# Patient Record
Sex: Male | Born: 1950 | Race: White | Hispanic: No | Marital: Married | Smoking: Never smoker
Health system: Southern US, Community
[De-identification: ages and names within clinical notes are randomized; demographics above are authoritative.]

## PROBLEM LIST (undated history)

## (undated) DIAGNOSIS — E7800 Pure hypercholesterolemia, unspecified: Secondary | ICD-10-CM

## (undated) DIAGNOSIS — H269 Unspecified cataract: Secondary | ICD-10-CM

## (undated) DIAGNOSIS — L723 Sebaceous cyst: Principal | ICD-10-CM

## (undated) DIAGNOSIS — T4145XA Adverse effect of unspecified anesthetic, initial encounter: Secondary | ICD-10-CM

## (undated) DIAGNOSIS — T8859XA Other complications of anesthesia, initial encounter: Secondary | ICD-10-CM

## (undated) HISTORY — PX: TONSILLECTOMY: SUR1361

## (undated) HISTORY — PX: ANTERIOR CRUCIATE LIGAMENT REPAIR: SHX115

## (undated) HISTORY — PX: COLONOSCOPY: SHX174

## (undated) HISTORY — PX: KNEE ARTHROSCOPY: SHX127

---

## 2007-02-11 ENCOUNTER — Ambulatory Visit: Payer: Self-pay | Admitting: Vascular Surgery

## 2010-02-05 ENCOUNTER — Emergency Department (HOSPITAL_COMMUNITY)
Admission: EM | Admit: 2010-02-05 | Discharge: 2010-02-05 | Payer: Self-pay | Source: Home / Self Care | Admitting: Family Medicine

## 2010-06-19 NOTE — Procedures (Signed)
DUPLEX DEEP VENOUS EXAM - LOWER EXTREMITY   INDICATION:  Rule out left leg DVT.  Patient has a painful knot in  approximately the mid calf which he has had for three days.   HISTORY:  Edema:  Yes, left calf for three days.  Trauma/Surgery:  No.  Pain:  Left calf for three days.  PE:  No.  Previous DVT:  No.  Anticoagulants:  Patient started on low dose aspirin today.  Other:   DUPLEX EXAM:                CFV   SFV   PopV  PTV    GSV                R  L  R  L  R  L  R   L  R  L  Thrombosis    0  0     0     0      0     0  Spontaneous   +  +     +     +      +     +  Phasic        +  +     +     +      +     +/0  Augmentation  +  +     +     +      +  Compressible  +  +     +     +      +     +/0  Competent     +  +     +     +      +   Legend:  + - yes  o - no  p - partial  D - decreased   IMPRESSION:  1. No evidence of deep venous thrombosis, left leg.  2. Left greater saphenous vein is thrombosed in the mid to distal left      calf.  3. Proximal left greater saphenous vein is patent.   Dr. Randel Books office was notified of these results, and a preliminary  copy was faxed to his office.    _____________________________  Larina Earthly, M.D.   DP/MEDQ  D:  02/11/2007  T:  02/11/2007  Job:  [id number]

## 2011-08-10 ENCOUNTER — Emergency Department (HOSPITAL_COMMUNITY)
Admission: EM | Admit: 2011-08-10 | Discharge: 2011-08-10 | Disposition: A | Discharge status: Home / Self Care | Payer: 59 | Attending: Emergency Medicine | Admitting: Emergency Medicine

## 2011-08-10 ENCOUNTER — Encounter (HOSPITAL_COMMUNITY): Payer: Self-pay | Admitting: Emergency Medicine

## 2011-08-10 ENCOUNTER — Emergency Department (HOSPITAL_COMMUNITY): Payer: 59

## 2011-08-10 DIAGNOSIS — Y998 Other external cause status: Secondary | ICD-10-CM | POA: Insufficient documentation

## 2011-08-10 DIAGNOSIS — S82009A Unspecified fracture of unspecified patella, initial encounter for closed fracture: Secondary | ICD-10-CM | POA: Insufficient documentation

## 2011-08-10 DIAGNOSIS — E7800 Pure hypercholesterolemia, unspecified: Secondary | ICD-10-CM | POA: Insufficient documentation

## 2011-08-10 DIAGNOSIS — Y9389 Activity, other specified: Secondary | ICD-10-CM | POA: Insufficient documentation

## 2011-08-10 HISTORY — DX: Pure hypercholesterolemia, unspecified: E78.00

## 2011-08-10 MED ORDER — OXYCODONE-ACETAMINOPHEN 7.5-325 MG PO TABS: 1.0000 | ORAL_TABLET | ORAL | Status: DC | PRN
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
## 2011-08-10 NOTE — ED Notes (Signed)
Family at bedside. 

## 2011-08-10 NOTE — Progress Notes (Signed)
Orthopedic Tech Progress Note Patient Details:  Aaron Brady [birth year] [id number]  Ortho Devices Type of Ortho Device: Crutches;Knee Immobilizer Ortho Device/Splint Location: right knee Ortho Device/Splint Interventions: Application   Aaron Brady 08/10/2011, 2:01 PM
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
## 2011-08-12 ENCOUNTER — Encounter (HOSPITAL_BASED_OUTPATIENT_CLINIC_OR_DEPARTMENT_OTHER): Payer: Self-pay | Admitting: *Deleted

## 2011-08-12 NOTE — Progress Notes (Signed)
No labs needed Has crutches and knee immobilizer on

## 2011-08-13 ENCOUNTER — Encounter (HOSPITAL_BASED_OUTPATIENT_CLINIC_OR_DEPARTMENT_OTHER): Payer: Self-pay | Admitting: Anesthesiology

## 2011-08-13 ENCOUNTER — Encounter (HOSPITAL_BASED_OUTPATIENT_CLINIC_OR_DEPARTMENT_OTHER): Payer: Self-pay | Admitting: Orthopedic Surgery

## 2011-08-13 ENCOUNTER — Ambulatory Visit (HOSPITAL_BASED_OUTPATIENT_CLINIC_OR_DEPARTMENT_OTHER): Payer: 59 | Admitting: Anesthesiology

## 2011-08-13 ENCOUNTER — Ambulatory Visit (HOSPITAL_BASED_OUTPATIENT_CLINIC_OR_DEPARTMENT_OTHER)
Admission: RE | Admit: 2011-08-13 | Discharge: 2011-08-13 | Disposition: A | Discharge status: Home / Self Care | Payer: 59 | Source: Ambulatory Visit | Attending: Orthopedic Surgery | Admitting: Orthopedic Surgery

## 2011-08-13 ENCOUNTER — Other Ambulatory Visit: Payer: Self-pay | Admitting: Orthopedic Surgery

## 2011-08-13 ENCOUNTER — Encounter (HOSPITAL_BASED_OUTPATIENT_CLINIC_OR_DEPARTMENT_OTHER): Payer: Self-pay | Admitting: *Deleted

## 2011-08-13 ENCOUNTER — Encounter (HOSPITAL_BASED_OUTPATIENT_CLINIC_OR_DEPARTMENT_OTHER)
Admission: RE | Disposition: A | Discharge status: Home / Self Care | Payer: Self-pay | Source: Ambulatory Visit | Attending: Orthopedic Surgery

## 2011-08-13 DIAGNOSIS — X58XXXA Exposure to other specified factors, initial encounter: Secondary | ICD-10-CM | POA: Insufficient documentation

## 2011-08-13 DIAGNOSIS — S82001A Unspecified fracture of right patella, initial encounter for closed fracture: Secondary | ICD-10-CM | POA: Diagnosis present

## 2011-08-13 DIAGNOSIS — S82009A Unspecified fracture of unspecified patella, initial encounter for closed fracture: Secondary | ICD-10-CM | POA: Insufficient documentation

## 2011-08-13 HISTORY — DX: Adverse effect of unspecified anesthetic, initial encounter: T41.45XA

## 2011-08-13 HISTORY — PX: ORIF PATELLA: SHX5033

## 2011-08-13 HISTORY — DX: Other complications of anesthesia, initial encounter: T88.59XA

## 2011-08-13 SURGERY — OPEN REDUCTION INTERNAL FIXATION (ORIF) PATELLA
Anesthesia: General | Site: Knee | Laterality: Right | Wound class: Clean

## 2011-08-13 MED ORDER — BUPIVACAINE-EPINEPHRINE PF 0.5-1:200000 % IJ SOLN
INTRAMUSCULAR | Status: DC | PRN
  Administered 2011-08-13: 25 mL

## 2011-08-13 MED ORDER — MIDAZOLAM HCL 2 MG/2ML IJ SOLN
0.5000 mg | INTRAMUSCULAR | Status: DC | PRN
  Administered 2011-08-13: 2 mg via INTRAVENOUS

## 2011-08-13 MED ORDER — OXYCODONE-ACETAMINOPHEN 10-325 MG PO TABS: 1.0000 | ORAL_TABLET | Freq: Four times a day (QID) | ORAL | Status: AC | PRN

## 2011-08-13 MED ORDER — PROMETHAZINE HCL 25 MG PO TABS: 25.0000 mg | ORAL_TABLET | Freq: Four times a day (QID) | ORAL | Status: DC | PRN

## 2011-08-13 MED ORDER — METHOCARBAMOL 500 MG PO TABS: 500.0000 mg | ORAL_TABLET | Freq: Four times a day (QID) | ORAL | Status: AC

## 2011-08-13 MED ORDER — HYDROMORPHONE HCL PF 1 MG/ML IJ SOLN: 0.2500 mg | INTRAMUSCULAR | Status: DC | PRN

## 2011-08-13 MED ORDER — FENTANYL CITRATE 0.05 MG/ML IJ SOLN
50.0000 ug | INTRAMUSCULAR | Status: DC | PRN
  Administered 2011-08-13: 100 ug via INTRAVENOUS

## 2011-08-13 MED ORDER — ONDANSETRON HCL 4 MG/2ML IJ SOLN
INTRAMUSCULAR | Status: DC | PRN
  Administered 2011-08-13: 4 mg via INTRAVENOUS

## 2011-08-13 MED ORDER — DEXAMETHASONE SODIUM PHOSPHATE 4 MG/ML IJ SOLN
INTRAMUSCULAR | Status: DC | PRN
  Administered 2011-08-13: 10 mg via INTRAVENOUS

## 2011-08-13 MED ORDER — LIDOCAINE HCL (CARDIAC) 20 MG/ML IV SOLN
INTRAVENOUS | Status: DC | PRN
  Administered 2011-08-13: 100 mg via INTRAVENOUS

## 2011-08-13 MED ORDER — LACTATED RINGERS IV SOLN
INTRAVENOUS | Status: DC
  Administered 2011-08-13 (×2): via INTRAVENOUS

## 2011-08-13 MED ORDER — CEFAZOLIN SODIUM-DEXTROSE 2-3 GM-% IV SOLR: 2.0000 g | INTRAVENOUS | Status: DC

## 2011-08-13 MED ORDER — OXYCODONE HCL 5 MG PO TABS
5.0000 mg | ORAL_TABLET | Freq: Once | ORAL | Status: AC | PRN
  Administered 2011-08-13: 5 mg via ORAL

## 2011-08-13 MED ORDER — METOCLOPRAMIDE HCL 5 MG/ML IJ SOLN: 10.0000 mg | Freq: Once | INTRAMUSCULAR | Status: DC | PRN

## 2011-08-13 MED ORDER — ACETAMINOPHEN 10 MG/ML IV SOLN
1000.0000 mg | Freq: Once | INTRAVENOUS | Status: AC
  Administered 2011-08-13: 1000 mg via INTRAVENOUS

## 2011-08-13 MED ORDER — PROPOFOL 10 MG/ML IV EMUL
INTRAVENOUS | Status: DC | PRN
  Administered 2011-08-13: 300 mg via INTRAVENOUS

## 2011-08-13 SURGICAL SUPPLY — 73 items
BANDAGE ELASTIC 4 VELCRO ST LF (GAUZE/BANDAGES/DRESSINGS) ×2 IMPLANT
BANDAGE ELASTIC 6 VELCRO ST LF (GAUZE/BANDAGES/DRESSINGS) ×2 IMPLANT
BANDAGE ESMARK 6X9 LF (GAUZE/BANDAGES/DRESSINGS) ×1 IMPLANT
BENZOIN TINCTURE PRP APPL 2/3 (GAUZE/BANDAGES/DRESSINGS) IMPLANT
BIT DRILL CANN 2.7X625 NONSTRL (BIT) ×2 IMPLANT
BLADE SURG 15 STRL LF DISP TIS (BLADE) ×3 IMPLANT
BLADE SURG 15 STRL SS (BLADE) ×3
BNDG ESMARK 6X9 LF (GAUZE/BANDAGES/DRESSINGS) ×2
CANISTER SUCTION 1200CC (MISCELLANEOUS) ×2 IMPLANT
CLOTH BEACON ORANGE TIMEOUT ST (SAFETY) ×2 IMPLANT
CUFF TOURNIQUET SINGLE 34IN LL (TOURNIQUET CUFF) IMPLANT
DECANTER SPIKE VIAL GLASS SM (MISCELLANEOUS) IMPLANT
DRAPE EXTREMITY T 121X128X90 (DRAPE) ×2 IMPLANT
DRAPE OEC MINIVIEW 54X84 (DRAPES) ×2 IMPLANT
DRAPE U-SHAPE 47X51 STRL (DRAPES) ×2 IMPLANT
DURAPREP 26ML APPLICATOR (WOUND CARE) ×2 IMPLANT
ELECT REM PT RETURN 9FT ADLT (ELECTROSURGICAL) ×2
ELECTRODE REM PT RTRN 9FT ADLT (ELECTROSURGICAL) ×1 IMPLANT
GLOVE BIOGEL PI IND STRL 7.5 (GLOVE) ×1 IMPLANT
GLOVE BIOGEL PI IND STRL 8 (GLOVE) ×2 IMPLANT
GLOVE BIOGEL PI INDICATOR 7.5 (GLOVE) ×1
GLOVE BIOGEL PI INDICATOR 8 (GLOVE) ×2
GLOVE ECLIPSE 6.5 STRL STRAW (GLOVE) ×4 IMPLANT
GLOVE INDICATOR 7.0 STRL GRN (GLOVE) ×4 IMPLANT
GLOVE ORTHO TXT STRL SZ7.5 (GLOVE) ×2 IMPLANT
GLOVE SKINSENSE NS SZ7.5 (GLOVE) ×1
GLOVE SKINSENSE STRL SZ7.5 (GLOVE) ×1 IMPLANT
GLOVE SURG ORTHO 8.0 STRL STRW (GLOVE) ×2 IMPLANT
GOWN PREVENTION PLUS XLARGE (GOWN DISPOSABLE) ×4 IMPLANT
GOWN PREVENTION PLUS XXLARGE (GOWN DISPOSABLE) ×2 IMPLANT
GOWN STRL REIN 2XL XLG LVL4 (GOWN DISPOSABLE) ×2 IMPLANT
GUIDEWARE NON THREAD 1.25X150 (WIRE) ×4
GUIDEWIRE NON THREAD 1.25X150 (WIRE) ×2 IMPLANT
NEEDLE KEITH (NEEDLE) IMPLANT
NS IRRIG 1000ML POUR BTL (IV SOLUTION) ×2 IMPLANT
PACK ARTHROSCOPY DSU (CUSTOM PROCEDURE TRAY) ×2 IMPLANT
PACK BASIN DAY SURGERY FS (CUSTOM PROCEDURE TRAY) ×2 IMPLANT
PAD CAST 4YDX4 CTTN HI CHSV (CAST SUPPLIES) ×1 IMPLANT
PADDING CAST ABS 4INX4YD NS (CAST SUPPLIES) ×1
PADDING CAST ABS COTTON 4X4 ST (CAST SUPPLIES) ×1 IMPLANT
PADDING CAST COTTON 4X4 STRL (CAST SUPPLIES) ×1
PENCIL BUTTON HOLSTER BLD 10FT (ELECTRODE) ×2 IMPLANT
SCREW CANN S THRD/36 4.0 (Screw) ×2 IMPLANT
SCREW SHORT THREAD 4.0X32 (Screw) ×4 IMPLANT
SLEEVE SCD COMPRESS KNEE MED (MISCELLANEOUS) ×2 IMPLANT
SPONGE GAUZE 4X4 12PLY (GAUZE/BANDAGES/DRESSINGS) ×2 IMPLANT
SPONGE LAP 18X18 X RAY DECT (DISPOSABLE) ×2 IMPLANT
SPONGE LAP 4X18 X RAY DECT (DISPOSABLE) ×2 IMPLANT
STAPLER VISISTAT 35W (STAPLE) IMPLANT
STRIP CLOSURE SKIN 1/2X4 (GAUZE/BANDAGES/DRESSINGS) IMPLANT
SUCTION FRAZIER TIP 10 FR DISP (SUCTIONS) ×2 IMPLANT
SUT ETHILON 3 0 PS 1 (SUTURE) IMPLANT
SUT ETHILON 4 0 PS 2 18 (SUTURE) IMPLANT
SUT FIBERWIRE #2 38 T-5 BLUE (SUTURE) ×4
SUT FIBERWIRE #5 38 CONV NDL (SUTURE)
SUT MNCRL AB 4-0 PS2 18 (SUTURE) IMPLANT
SUT VIC AB 0 CT1 27 (SUTURE) ×1
SUT VIC AB 0 CT1 27XBRD ANBCTR (SUTURE) ×1 IMPLANT
SUT VIC AB 1 CT1 27 (SUTURE) ×1
SUT VIC AB 1 CT1 27XBRD ANBCTR (SUTURE) ×1 IMPLANT
SUT VIC AB 2-0 SH 18 (SUTURE) IMPLANT
SUT VIC AB 2-0 SH 27 (SUTURE)
SUT VIC AB 2-0 SH 27XBRD (SUTURE) IMPLANT
SUT VIC AB 3-0 SH 27 (SUTURE) ×2
SUT VIC AB 3-0 SH 27X BRD (SUTURE) ×2 IMPLANT
SUT VICRYL 3-0 CR8 SH (SUTURE) IMPLANT
SUT VICRYL 4-0 PS2 18IN ABS (SUTURE) IMPLANT
SUTURE FIBERWR #2 38 T-5 BLUE (SUTURE) ×2 IMPLANT
SUTURE FIBERWR #5 38 CONV NDL (SUTURE) IMPLANT
SYR BULB 3OZ (MISCELLANEOUS) ×2 IMPLANT
UNDERPAD 30X30 INCONTINENT (UNDERPADS AND DIAPERS) ×2 IMPLANT
WATER STERILE IRR 1000ML POUR (IV SOLUTION) IMPLANT
YANKAUER SUCT BULB TIP NO VENT (SUCTIONS) IMPLANT

## 2011-08-13 NOTE — Anesthesia Preprocedure Evaluation (Signed)
Anesthesia Evaluation  Patient identified by MRN, date of birth, ID band Patient awake    Reviewed: Allergy & Precautions, H&P , NPO status , Patient's Chart, lab work & pertinent test results, reviewed documented beta blocker date and time   Airway Mallampati: II TM Distance: >3 FB Neck ROM: full    Dental   Pulmonary neg pulmonary ROS,          Cardiovascular negative cardio ROS      Neuro/Psych negative neurological ROS  negative psych ROS   GI/Hepatic negative GI ROS, Neg liver ROS,   Endo/Other  negative endocrine ROS  Renal/GU negative Renal ROS  negative genitourinary   Musculoskeletal   Abdominal   Peds  Hematology negative hematology ROS (+)   Anesthesia Other Findings See surgeon's H&P   Reproductive/Obstetrics negative OB ROS                           Anesthesia Physical Anesthesia Plan  ASA: I  Anesthesia Plan: General   Post-op Pain Management:    Induction: Intravenous  Airway Management Planned: LMA  Additional Equipment:   Intra-op Plan:   Post-operative Plan: Extubation in OR  Informed Consent: I have reviewed the patients History and Physical, chart, labs and discussed the procedure including the risks, benefits and alternatives for the proposed anesthesia with the patient or authorized representative who has indicated his/her understanding and acceptance.   Dental Advisory Given  Plan Discussed with: CRNA and Surgeon  Anesthesia Plan Comments:         Anesthesia Quick Evaluation

## 2011-08-13 NOTE — Anesthesia Procedure Notes (Addendum)
Anesthesia Regional Block:  Femoral nerve block  Pre-Anesthetic Checklist: ,, timeout performed, Correct Patient, Correct Site, Correct Laterality, Correct Procedure, Correct Position, site marked, Risks and benefits discussed,  Surgical consent,  Pre-op evaluation,  At surgeon's request and post-op pain management  Laterality: Right  Prep: chloraprep       Needles:   Needle Type: Other   (Arrow Echogenic)   Needle Length: 9cm  Needle Gauge: 21    Additional Needles:  Procedures: ultrasound guided Femoral nerve block Narrative:  Start time: 08/13/2011 9:18 AM End time: 08/13/2011 9:25 AM Injection made incrementally with aspirations every 5 mL.  Performed by: Personally  Anesthesiologist: C. Frederick MD  Additional Notes: Ultrasound guidance used to: id relevant anatomy, confirm needle position, local anesthetic spread, avoidance of vascular puncture. Picture saved. No complications. Block performed personally by Janetta Hora. Gelene Mink, MD    Femoral nerve block Procedure Name: LMA Insertion Date/Time: 08/13/2011 10:44 AM Performed by: Zenia Resides D Pre-anesthesia Checklist: Patient identified, Emergency Drugs available, Suction available, Patient being monitored and Timeout performed Patient Re-evaluated:Patient Re-evaluated prior to inductionOxygen Delivery Method: Circle System Utilized Preoxygenation: Pre-oxygenation with 100% oxygen Intubation Type: IV induction Ventilation: Mask ventilation without difficulty LMA: LMA inserted LMA Size: 4.0 Number of attempts: 1 Airway Equipment and Method: bite block Placement Confirmation: positive ETCO2 and breath sounds checked- equal and bilateral Tube secured with: Tape Dental Injury: Teeth and Oropharynx as per pre-operative assessment

## 2011-08-13 NOTE — Op Note (Signed)
08/13/2011  12:20 PM  PATIENT:  Aaron Brady    PRE-OPERATIVE DIAGNOSIS:  patella fracture on right  POST-OPERATIVE DIAGNOSIS:  Same  PROCEDURE:  OPEN REDUCTION INTERNAL (ORIF) FIXATION PATELLA  SURGEON:  Eulas Post, MD  PHYSICIAN ASSISTANT: Janace Litten, OPA-C, present and scrubbed throughout the case, critical for completion in a timely fashion, and for retraction, instrumentation, and closure.  ANESTHESIA:   General  PREOPERATIVE INDICATIONS:  Aaron Brady is a  61 y.o. male with a diagnosis of patella fracture on right who failed conservative measures and elected for surgical management.  He had a stellate complex four-part patella fracture.  The risks benefits and alternatives were discussed with the patient preoperatively including but not limited to the risks of infection, bleeding, nerve injury, cardiopulmonary complications, the need for revision surgery, among others, and the patient was willing to proceed. We also discussed the risks of hardware prominence, hardware failure, need for hardware removal, among others.  OPERATIVE IMPLANTS: 4.0 mm cannulated screws x2 with a total of 2 #2 FiberWire in a figure-of-eight fashion through the screws and tied over the top.  OPERATIVE FINDINGS: Stellate comminuted patella fracture  OPERATIVE PROCEDURE: The patient was brought to the operating room and placed in the supine position. General anesthesia was administered. IV antibiotics were given. The right lower extremity was prepped and draped in usual sterile fashion. Time out was performed. The leg was elevated and exsanguinated and tourniquet was inflated. Anterior incision was made over the fracture site. Fracture pieces were identified and cleaned and all soft tissue that was interposed was removed. There were 2 very small pieces, one on the medial side and one on the lateral side, that were excised. These were too small for fixation. The main segment was then a superior and inferior  segment, and these were held together with a clamp, and then K wires were placed across the fracture, and the length of the K wires measured. I intentionally left the screws slightly shy of the far cortex, with the intention of tying FiberWire over the screw tips. This would leave a bone bridge to prevent FiberWire breakage.  I confirmed with C-arm appropriate reduction and anatomic position of the articular surface. I then drilled partially threaded 40 cannulated screws across both sides of the fracture. Initially, I placed the FiberWire, but then I was unsatisfied comments are removed the FiberWire and placed the screw that was through the distal pole, and turned it a couple more turns to make sure the head was completely flush with the distal pole. Excellent compression and fixation was achieved. I then used a suture passer to pass 2 #2 FiberWire is through each of the cannulated screws and then tied them, one superiorly and one inferiorly. Excellent fixation was achieved.  I irrigated the wounds copiously, and repaired the retinaculum with #1 Vicryl on either side, and then repaired the subcutaneous tissue with 3-0 Vicryl followed by Steri-Strips and sterile gauze for the skin. He alert he received a femoral nerve block. The tourniquet was released, total tourniquet time approximately one hour and 20 minutes. He tolerated the procedure well and there no complications. Sterile gauze as well as a knee immobilizer was applied.

## 2011-08-13 NOTE — Transfer of Care (Signed)
Immediate Anesthesia Transfer of Care Note  Patient: Aaron Brady  Procedure(s) Performed: Procedure(s) (LRB): OPEN REDUCTION INTERNAL (ORIF) FIXATION PATELLA (Right)  Patient Location: PACU  Anesthesia Type: General and Regional  Level of Consciousness: awake and oriented  Airway & Oxygen Therapy: Patient Spontanous Breathing and Patient connected to face mask oxygen  Post-op Assessment: Report given to PACU RN and Post -op Vital signs reviewed and stable  Post vital signs: Reviewed and stable  Complications: No apparent anesthesia complications

## 2011-08-13 NOTE — Progress Notes (Signed)
Assisted Dr. Frederick with right, ultrasound guided, femoral block. Side rails up, monitors on throughout procedure. See vital signs in flow sheet. Tolerated Procedure well. 

## 2011-08-13 NOTE — Anesthesia Postprocedure Evaluation (Signed)
Anesthesia Post Note  Patient: Aaron Brady  Procedure(s) Performed: Procedure(s) (LRB): OPEN REDUCTION INTERNAL (ORIF) FIXATION PATELLA (Right)  Anesthesia type: General  Patient location: PACU  Post pain: Pain level controlled  Post assessment: Patient's Cardiovascular Status Stable  Last Vitals:  Filed Vitals:   08/13/11 1328  BP: 155/85  Pulse: 79  Temp: 36.4 C  Resp: 16    Post vital signs: Reviewed and stable  Level of consciousness: alert  Complications: No apparent anesthesia complications

## 2011-08-13 NOTE — H&P (Signed)
  PREOPERATIVE H&P  Chief Complaint: patella fracture on right  HPI: Aaron Brady is a 61 y.o. male who presents for preoperative history and physical with a diagnosis of patella fracture on right. Symptoms are rated as moderate to severe, and have been worsening.  This is significantly impairing activities of daily living.  He has elected for surgical management.   Past Medical History  Diagnosis Date  . Hypercholesterolemia   . No pertinent past medical history   . Complication of anesthesia     gets cold and shaky post op   Past Surgical History  Procedure Date  . Anterior cruciate ligament repair left knee  . Fractured left leg   . Tonsillectomy   . Colonoscopy   . Knee arthroscopy     right   History   Social History  . Marital Status: Married    Spouse Name: N/A    Number of Children: N/A  . Years of Education: N/A   Social History Main Topics  . Smoking status: Never Smoker   . Smokeless tobacco: None  . Alcohol Use: No  . Drug Use: No  . Sexually Active:    Other Topics Concern  . None   Social History Narrative  . None   History reviewed. No pertinent family history. Allergies  Allergen Reactions  . Penicillins Hives   Prior to Admission medications   Medication Sig Start Date End Date Taking? Authorizing Provider  ibuprofen (ADVIL,MOTRIN) 200 MG tablet Take 400 mg by mouth every 6 (six) hours as needed. For pain   Yes Historical Provider, MD  oxyCODONE-acetaminophen (PERCOCET) 7.5-325 MG per tablet Take 1 tablet by mouth every 4 (four) hours as needed for pain. 08/10/11 08/20/11 Yes Nelia Shi, MD  pravastatin (PRAVACHOL) 40 MG tablet Take 40 mg by mouth daily.   Yes Historical Provider, MD     Positive ROS: All other systems have been reviewed and were otherwise negative with the exception of those mentioned in the HPI and as above.  Physical Exam: General: Alert, no acute distress Cardiovascular: No pedal edema Respiratory: No cyanosis, no use  of accessory musculature GI: No organomegaly, abdomen is soft and non-tender Skin: No lesions in the area of chief complaint Neurologic: Sensation intact distally Psychiatric: Patient is competent for consent with normal mood and affect Lymphatic: No axillary or cervical lymphadenopathy  MUSCULOSKELETAL: right knee has incompetent extensor mechanism, sensation intact.   Assessment: patella fracture on right  Plan: Plan for Procedure(s): OPEN REDUCTION INTERNAL (ORIF) FIXATION PATELLA  The risks benefits and alternatives were discussed with the patient including but not limited to the risks of nonoperative treatment, versus surgical intervention including infection, bleeding, nerve injury,  blood clots, cardiopulmonary complications, morbidity, mortality, among others, and they were willing to proceed.   Aaron Brady P, MD Cell [phone] Pager [phone]  08/13/2011 10:24 AM

## 2011-08-15 ENCOUNTER — Encounter (HOSPITAL_BASED_OUTPATIENT_CLINIC_OR_DEPARTMENT_OTHER): Payer: Self-pay | Admitting: Orthopedic Surgery

## 2012-01-05 DIAGNOSIS — H269 Unspecified cataract: Secondary | ICD-10-CM

## 2012-01-05 DIAGNOSIS — L723 Sebaceous cyst: Secondary | ICD-10-CM

## 2012-01-05 HISTORY — DX: Sebaceous cyst: L72.3

## 2012-01-05 HISTORY — DX: Unspecified cataract: H26.9

## 2012-01-08 ENCOUNTER — Encounter (INDEPENDENT_AMBULATORY_CARE_PROVIDER_SITE_OTHER): Payer: Self-pay | Admitting: Surgery

## 2012-01-08 ENCOUNTER — Ambulatory Visit (INDEPENDENT_AMBULATORY_CARE_PROVIDER_SITE_OTHER): Payer: Commercial Managed Care - PPO | Admitting: Surgery

## 2012-01-08 VITALS — BP 118/86 | HR 68 | Temp 97.3°F | Resp 12 | Ht 69.0 in | Wt 184.2 lb

## 2012-01-08 DIAGNOSIS — L723 Sebaceous cyst: Secondary | ICD-10-CM

## 2012-01-08 NOTE — Patient Instructions (Signed)

## 2012-01-08 NOTE — Progress Notes (Signed)
General Surgery [facility]  Chief Complaint  Patient presents with  . New Evaluation    eval of seb cyst - mid back - referral from Dr. Blair Heys    HISTORY: The patient is a 61 year old white male referred by his primary care physician with an enlarging sebaceous cyst in the right mid back. Patient states that this is been present for some time and gradually increasing in size. It causes minor discomfort. He has had no drainage. He has had no history of infection. He has never required incision and drainage. He has had no other such lesions removed in the past.  Past Medical History  Diagnosis Date  . Hypercholesterolemia   . No pertinent past medical history   . Complication of anesthesia     gets cold and shaky post op  . Right patella fracture 08/13/2011     Current Outpatient Prescriptions  Medication Sig Dispense Refill  . pravastatin (PRAVACHOL) 40 MG tablet Take 40 mg by mouth daily.      . promethazine (PHENERGAN) 25 MG tablet Take 1 tablet (25 mg total) by mouth every 6 (six) hours as needed for nausea.  30 tablet  0     Allergies  Allergen Reactions  . Penicillins Hives     Family History  Problem Relation Age of Onset  . Heart disease Father   . Cancer Sister     melanoma  . Cancer Paternal Grandfather     stomach     History   Social History  . Marital Status: Married    Spouse Name: N/A    Number of Children: N/A  . Years of Education: N/A   Social History Main Topics  . Smoking status: Never Smoker   . Smokeless tobacco: Never Used  . Alcohol Use: No  . Drug Use: No  . Sexually Active: None   Other Topics Concern  . None   Social History Narrative  . None     REVIEW OF SYSTEMS - PERTINENT POSITIVES ONLY: Denies drainage. Denies infection. Minor pain. Progressive slow enlargement.  EXAM: Filed Vitals:   01/08/12 0846  BP: 118/86  Pulse: 68  Temp: 97.3 F (36.3 C)  Resp: 12    HEENT: normocephalic;  pupils equal and reactive; sclerae clear; dentition good; mucous membranes moist NECK:  symmetric on extension; no palpable anterior or posterior cervical lymphadenopathy; no supraclavicular masses; no tenderness CHEST: clear to auscultation bilaterally without rales, rhonchi, or wheezes CARDIAC: regular rate and rhythm without significant murmur; peripheral pulses are full BACK:  In the mid back just to the right of midline is a 3 cm epidermal inclusion cyst with slight overlying erythema, no fluctuance, minimal tenderness EXT:  non-tender without edema; no deformity NEURO: no gross focal deficits; no sign of tremor   LABORATORY RESULTS: See [facility] ([facility]) for most recent results   RADIOLOGY RESULTS: See [facility] ([facility]pic) for most recent results   IMPRESSION: Epidermal inclusion cyst, 3 cm, right mid back  PLAN: I discussed with the patient the indications for excision. We discussed the procedure and the postoperative course to be anticipated. We discussed potential complications. Patient understands and wishes to proceed in the near future. We will make arrangements for outpatient surgery.  The risks and benefits of the procedure have been discussed at length with the patient.  The patient understands the proposed procedure, potential alternative treatments, and the course of recovery to be expected.  All of the patient's questions have  been answered at this time.  The patient wishes to proceed with surgery.  Velora Heckler, MD, FACS General & Endocrine Surgery [facility]   Visit Diagnoses: 1. Sebaceous cyst, right mid back     Primary Care Physician: Thora Lance, MD

## 2012-01-10 ENCOUNTER — Encounter (HOSPITAL_BASED_OUTPATIENT_CLINIC_OR_DEPARTMENT_OTHER): Payer: Self-pay | Admitting: *Deleted

## 2012-01-13 ENCOUNTER — Encounter (HOSPITAL_BASED_OUTPATIENT_CLINIC_OR_DEPARTMENT_OTHER): Payer: Self-pay | Admitting: *Deleted

## 2012-01-13 ENCOUNTER — Telehealth (INDEPENDENT_AMBULATORY_CARE_PROVIDER_SITE_OTHER): Payer: Self-pay

## 2012-01-13 NOTE — Telephone Encounter (Signed)
Lmom with date of appt 02-11-12 arrive at 9:45.

## 2012-01-14 ENCOUNTER — Ambulatory Visit (HOSPITAL_BASED_OUTPATIENT_CLINIC_OR_DEPARTMENT_OTHER): Payer: 59 | Admitting: Anesthesiology

## 2012-01-14 ENCOUNTER — Encounter (HOSPITAL_BASED_OUTPATIENT_CLINIC_OR_DEPARTMENT_OTHER): Payer: Self-pay | Admitting: Surgery

## 2012-01-14 ENCOUNTER — Encounter (HOSPITAL_BASED_OUTPATIENT_CLINIC_OR_DEPARTMENT_OTHER)
Admission: RE | Disposition: A | Discharge status: Home / Self Care | Payer: Self-pay | Source: Ambulatory Visit | Attending: Surgery

## 2012-01-14 ENCOUNTER — Encounter (HOSPITAL_BASED_OUTPATIENT_CLINIC_OR_DEPARTMENT_OTHER): Payer: Self-pay | Admitting: Anesthesiology

## 2012-01-14 ENCOUNTER — Encounter (HOSPITAL_BASED_OUTPATIENT_CLINIC_OR_DEPARTMENT_OTHER): Payer: Self-pay | Admitting: *Deleted

## 2012-01-14 ENCOUNTER — Ambulatory Visit (HOSPITAL_BASED_OUTPATIENT_CLINIC_OR_DEPARTMENT_OTHER)
Admission: RE | Admit: 2012-01-14 | Discharge: 2012-01-14 | Disposition: A | Discharge status: Home / Self Care | Payer: 59 | Source: Ambulatory Visit | Attending: Surgery | Admitting: Surgery

## 2012-01-14 DIAGNOSIS — L723 Sebaceous cyst: Secondary | ICD-10-CM | POA: Insufficient documentation

## 2012-01-14 DIAGNOSIS — E7800 Pure hypercholesterolemia, unspecified: Secondary | ICD-10-CM | POA: Insufficient documentation

## 2012-01-14 DIAGNOSIS — Z880 Allergy status to penicillin: Secondary | ICD-10-CM | POA: Insufficient documentation

## 2012-01-14 HISTORY — PX: EAR CYST EXCISION: SHX22

## 2012-01-14 HISTORY — DX: Sebaceous cyst: L72.3

## 2012-01-14 HISTORY — DX: Unspecified cataract: H26.9

## 2012-01-14 SURGERY — CYST REMOVAL
Anesthesia: Monitor Anesthesia Care | Site: Back | Wound class: Clean

## 2012-01-14 MED ORDER — LIDOCAINE HCL (CARDIAC) 20 MG/ML IV SOLN
INTRAVENOUS | Status: DC | PRN
  Administered 2012-01-14: 50 mg via INTRAVENOUS

## 2012-01-14 MED ORDER — FENTANYL CITRATE 0.05 MG/ML IJ SOLN: 25.0000 ug | INTRAMUSCULAR | Status: DC | PRN

## 2012-01-14 MED ORDER — PROPOFOL 10 MG/ML IV EMUL
INTRAVENOUS | Status: DC | PRN
  Administered 2012-01-14: 80 ug/kg/min via INTRAVENOUS

## 2012-01-14 MED ORDER — CEFAZOLIN SODIUM-DEXTROSE 2-3 GM-% IV SOLR
2.0000 g | INTRAVENOUS | Status: AC
  Administered 2012-01-14: 2 g via INTRAVENOUS

## 2012-01-14 MED ORDER — ONDANSETRON HCL 4 MG/2ML IJ SOLN: 4.0000 mg | Freq: Four times a day (QID) | INTRAMUSCULAR | Status: DC | PRN

## 2012-01-14 MED ORDER — LACTATED RINGERS IV SOLN
INTRAVENOUS | Status: DC
  Administered 2012-01-14: 13:00:00 via INTRAVENOUS

## 2012-01-14 MED ORDER — OXYCODONE HCL 5 MG PO TABS: 5.0000 mg | ORAL_TABLET | Freq: Once | ORAL | Status: DC | PRN

## 2012-01-14 MED ORDER — HYDROCODONE-ACETAMINOPHEN 5-325 MG PO TABS
1.0000 | ORAL_TABLET | ORAL | Status: AC | PRN
Start: 2012-01-14 — End: ?

## 2012-01-14 MED ORDER — BUPIVACAINE HCL (PF) 0.5 % IJ SOLN
INTRAMUSCULAR | Status: DC | PRN
  Administered 2012-01-14: 15 mL

## 2012-01-14 MED ORDER — ONDANSETRON HCL 4 MG/2ML IJ SOLN
INTRAMUSCULAR | Status: DC | PRN
  Administered 2012-01-14: 4 mg via INTRAVENOUS

## 2012-01-14 MED ORDER — OXYCODONE HCL 5 MG/5ML PO SOLN: 5.0000 mg | Freq: Once | ORAL | Status: DC | PRN

## 2012-01-14 MED ORDER — MIDAZOLAM HCL 5 MG/5ML IJ SOLN
INTRAMUSCULAR | Status: DC | PRN
  Administered 2012-01-14: 2 mg via INTRAVENOUS

## 2012-01-14 MED ORDER — FENTANYL CITRATE 0.05 MG/ML IJ SOLN
INTRAMUSCULAR | Status: DC | PRN
  Administered 2012-01-14: 100 ug via INTRAVENOUS

## 2012-01-14 SURGICAL SUPPLY — 40 items
BANDAGE GAUZE ELAST BULKY 4 IN (GAUZE/BANDAGES/DRESSINGS) IMPLANT
BENZOIN TINCTURE PRP APPL 2/3 (GAUZE/BANDAGES/DRESSINGS) IMPLANT
BLADE SURG 15 STRL LF DISP TIS (BLADE) ×1 IMPLANT
BLADE SURG 15 STRL SS (BLADE) ×1
BNDG COHESIVE 4X5 TAN STRL (GAUZE/BANDAGES/DRESSINGS) IMPLANT
CHLORAPREP W/TINT 26ML (MISCELLANEOUS) ×2 IMPLANT
CLEANER CAUTERY TIP 5X5 PAD (MISCELLANEOUS) IMPLANT
CLOTH BEACON ORANGE TIMEOUT ST (SAFETY) ×2 IMPLANT
COVER MAYO STAND STRL (DRAPES) ×2 IMPLANT
COVER TABLE BACK 60X90 (DRAPES) ×2 IMPLANT
DECANTER SPIKE VIAL GLASS SM (MISCELLANEOUS) IMPLANT
DRAPE EXTREMITY T 121X128X90 (DRAPE) IMPLANT
DRAPE PED LAPAROTOMY (DRAPES) ×2 IMPLANT
DRAPE U-SHAPE 76X120 STRL (DRAPES) IMPLANT
DRAPE UTILITY XL STRL (DRAPES) ×2 IMPLANT
DRSG TEGADERM 4X4.75 (GAUZE/BANDAGES/DRESSINGS) IMPLANT
ELECT REM PT RETURN 9FT ADLT (ELECTROSURGICAL) ×2
ELECTRODE REM PT RTRN 9FT ADLT (ELECTROSURGICAL) ×1 IMPLANT
GAUZE SPONGE 4X4 12PLY STRL LF (GAUZE/BANDAGES/DRESSINGS) ×2 IMPLANT
GLOVE BIOGEL PI IND STRL 7.0 (GLOVE) ×2 IMPLANT
GLOVE BIOGEL PI INDICATOR 7.0 (GLOVE) ×2
GLOVE ECLIPSE 6.5 STRL STRAW (GLOVE) ×2 IMPLANT
GLOVE ECLIPSE 7.5 STRL STRAW (GLOVE) ×2 IMPLANT
GLOVE SURG ORTHO 8.0 STRL STRW (GLOVE) ×2 IMPLANT
GOWN PREVENTION PLUS XLARGE (GOWN DISPOSABLE) ×4 IMPLANT
GOWN PREVENTION PLUS XXLARGE (GOWN DISPOSABLE) ×2 IMPLANT
NEEDLE HYPO 25X1 1.5 SAFETY (NEEDLE) ×2 IMPLANT
PACK BASIN DAY SURGERY FS (CUSTOM PROCEDURE TRAY) ×2 IMPLANT
PAD CLEANER CAUTERY TIP 5X5 (MISCELLANEOUS)
PENCIL BUTTON HOLSTER BLD 10FT (ELECTRODE) ×2 IMPLANT
SHEET MEDIUM DRAPE 40X70 STRL (DRAPES) IMPLANT
STOCKINETTE 4X48 STRL (DRAPES) IMPLANT
STRIP CLOSURE SKIN 1/2X4 (GAUZE/BANDAGES/DRESSINGS) IMPLANT
SUT ETHILON 3 0 PS 1 (SUTURE) IMPLANT
SUT VICRYL 3-0 CR8 SH (SUTURE) ×2 IMPLANT
SUT VICRYL 4-0 PS2 18IN ABS (SUTURE) IMPLANT
SYR CONTROL 10ML LL (SYRINGE) ×2 IMPLANT
TOWEL OR 17X24 6PK STRL BLUE (TOWEL DISPOSABLE) ×4 IMPLANT
TOWEL OR NON WOVEN STRL DISP B (DISPOSABLE) ×2 IMPLANT
WATER STERILE IRR 1000ML POUR (IV SOLUTION) IMPLANT

## 2012-01-14 NOTE — Anesthesia Postprocedure Evaluation (Signed)
Anesthesia Post Note  Patient: Aaron Brady  Procedure(s) Performed: Procedure(s) (LRB): CYST REMOVAL (N/A)  Anesthesia type: MAC  Patient location: PACU  Post pain: Pain level controlled and Adequate analgesia  Post assessment: Post-op Vital signs reviewed, Patient's Cardiovascular Status Stable and Respiratory Function Stable  Last Vitals:  Filed Vitals:   01/14/12 1541  BP: 122/81  Pulse: 75  Temp: 36.8 C  Resp: 20    Post vital signs: Reviewed and stable  Level of consciousness: awake, alert  and oriented  Complications: No apparent anesthesia complications

## 2012-01-14 NOTE — Anesthesia Procedure Notes (Signed)
Procedure Name: MAC Date/Time: 01/14/2012 3:14 PM Performed by: Verlan Friends Pre-anesthesia Checklist: Patient identified, Timeout performed, Emergency Drugs available, Suction available and Patient being monitored Patient Re-evaluated:Patient Re-evaluated prior to inductionOxygen Delivery Method: Simple face mask Placement Confirmation: positive ETCO2

## 2012-01-14 NOTE — Interval H&P Note (Signed)
History and Physical Interval Note:  01/14/2012 2:38 PM  Aaron Brady  has presented today for surgery, with the diagnosis of sebaceous cyst on back.  The various methods of treatment have been discussed with the patient and family. After consideration of risks, benefits and other options for treatment, the patient has consented to    Procedure(s) (LRB) with comments: CYST REMOVAL (N/A) as a surgical intervention .    The patient's history has been reviewed, patient examined, no change in status, stable for surgery.  I have reviewed the patient's chart and labs.  Questions were answered to the patient's satisfaction.    Velora Heckler, MD, [facility] Office: [phone]    Brigitte Soderberg Judie Petit

## 2012-01-14 NOTE — Transfer of Care (Signed)
Immediate Anesthesia Transfer of Care Note  Patient: Aaron Brady  Procedure(s) Performed: Procedure(s) (LRB) with comments: CYST REMOVAL (N/A)  Patient Location: PACU  Anesthesia Type:MAC  Level of Consciousness: awake, alert , oriented and patient cooperative  Airway & Oxygen Therapy: Patient Spontanous Breathing and Patient connected to face mask oxygen  Post-op Assessment: Report given to PACU RN and Post -op Vital signs reviewed and stable  Post vital signs: Reviewed and stable  Complications: No apparent anesthesia complications

## 2012-01-14 NOTE — Anesthesia Preprocedure Evaluation (Signed)
Anesthesia Evaluation  Patient identified by MRN, date of birth, ID band Patient awake    Reviewed: Allergy & Precautions, H&P , NPO status , Patient's Chart, lab work & pertinent test results  Airway Mallampati: II  Neck ROM: full    Dental   Pulmonary          Cardiovascular     Neuro/Psych    GI/Hepatic   Endo/Other    Renal/GU      Musculoskeletal   Abdominal   Peds  Hematology   Anesthesia Other Findings   Reproductive/Obstetrics                           Anesthesia Physical Anesthesia Plan  ASA: II  Anesthesia Plan: MAC   Post-op Pain Management:    Induction: Intravenous  Airway Management Planned: Simple Face Mask  Additional Equipment:   Intra-op Plan:   Post-operative Plan:   Informed Consent: I have reviewed the patients History and Physical, chart, labs and discussed the procedure including the risks, benefits and alternatives for the proposed anesthesia with the patient or authorized representative who has indicated his/her understanding and acceptance.     Plan Discussed with: CRNA and Surgeon  Anesthesia Plan Comments:         Anesthesia Quick Evaluation  

## 2012-01-14 NOTE — H&P (View-Only) (Signed)
[facility]  Chief Complaint  Patient presents with  . New Evaluation    eval of seb cyst - mid back - referral from Dr. Robert Ehinger    HISTORY: The patient is a 61-year-old white male referred by his primary care physician with an enlarging sebaceous cyst in the right mid back. Patient states that this is been present for some time and gradually increasing in size. It causes minor discomfort. He has had no drainage. He has had no history of infection. He has never required incision and drainage. He has had no other such lesions removed in the past.  Past Medical History  Diagnosis Date  . Hypercholesterolemia   . No pertinent past medical history   . Complication of anesthesia     gets cold and shaky post op  . Right patella fracture 08/13/2011     Current Outpatient Prescriptions  Medication Sig Dispense Refill  . pravastatin (PRAVACHOL) 40 MG tablet Take 40 mg by mouth daily.      . promethazine (PHENERGAN) 25 MG tablet Take 1 tablet (25 mg total) by mouth every 6 (six) hours as needed for nausea.  30 tablet  0     Allergies  Allergen Reactions  . Penicillins Hives     Family History  Problem Relation Age of Onset  . Heart disease Father   . Cancer Sister     melanoma  . Cancer Paternal Grandfather     stomach     History   Social History  . Marital Status: Married    Spouse Name: N/A    Number of Children: N/A  . Years of Education: N/A   Social History Main Topics  . Smoking status: Never Smoker   . Smokeless tobacco: Never Used  . Alcohol Use: No  . Drug Use: No  . Sexually Active: None   Other Topics Concern  . None   Social History Narrative  . None     REVIEW OF SYSTEMS - PERTINENT POSITIVES ONLY: Denies drainage. Denies infection. Minor pain. Progressive slow enlargement.  EXAM: Filed Vitals:   01/08/12 0846  BP: 118/86  Pulse: 68  Temp: 97.3 F (36.3 C)  Resp: 12    HEENT: normocephalic;  pupils equal and reactive; sclerae clear; dentition good; mucous membranes moist NECK:  symmetric on extension; no palpable anterior or posterior cervical lymphadenopathy; no supraclavicular masses; no tenderness CHEST: clear to auscultation bilaterally without rales, rhonchi, or wheezes CARDIAC: regular rate and rhythm without significant murmur; peripheral pulses are full BACK:  In the mid back just to the right of midline is a 3 cm epidermal inclusion cyst with slight overlying erythema, no fluctuance, minimal tenderness EXT:  non-tender without edema; no deformity NEURO: no gross focal deficits; no sign of tremor   LABORATORY RESULTS: See [facility] (CHL-Epic) for most recent results   RADIOLOGY RESULTS: See [facility] (CHL-Epic) for most recent results   IMPRESSION: Epidermal inclusion cyst, 3 cm, right mid back  PLAN: I discussed with the patient the indications for excision. We discussed the procedure and the postoperative course to be anticipated. We discussed potential complications. Patient understands and wishes to proceed in the near future. We will make arrangements for outpatient surgery.  The risks and benefits of the procedure have been discussed at length with the patient.  The patient understands the proposed procedure, potential alternative treatments, and the course of recovery to be expected.  All of the patient's questions have   been answered at this time.  The patient wishes to proceed with surgery.  Jeanet Lupe M. Tihanna Goodson, MD, FACS General & Endocrine Surgery [facility]   Visit Diagnoses: 1. Sebaceous cyst, right mid back     Primary Care Physician: EHINGER,ROBERT R, MD   

## 2012-01-14 NOTE — Brief Op Note (Signed)
01/14/2012  3:38 PM  PATIENT:  Aaron Brady  61 y.o. male  PRE-OPERATIVE DIAGNOSIS:  sebaceous cyst on back  POST-OPERATIVE DIAGNOSIS:  sebaceous cyst of mid back  PROCEDURE:  Procedure(s) (LRB) with comments: CYST REMOVAL (N/A)  SURGEON:  Surgeon(s) and Role:    * Velora Heckler, MD - Primary  ANESTHESIA:   IV sedation  EBL:  Total I/O In: 900 [I.V.:900] Out: -   BLOOD ADMINISTERED:none  DRAINS: none   LOCAL MEDICATIONS USED:  MARCAINE     SPECIMEN:  Excision  DISPOSITION OF SPECIMEN:  PATHOLOGY  COUNTS:  YES  TOURNIQUET:  * No tourniquets in log *  DICTATION: .Other Dictation: Dictation Number [id number]  PLAN OF CARE: Discharge to home after PACU  PATIENT DISPOSITION:  PACU - hemodynamically stable.   Delay start of Pharmacological VTE agent (>24hrs) due to surgical blood loss or risk of bleeding: yes  Velora Heckler, MD, [facility] Office: [phone]

## 2012-01-15 ENCOUNTER — Encounter (HOSPITAL_BASED_OUTPATIENT_CLINIC_OR_DEPARTMENT_OTHER): Payer: Self-pay | Admitting: Surgery

## 2012-01-15 NOTE — Op Note (Signed)
Aaron Brady, Aaron Brady NO.:  [id number]  MEDICAL RECORD NO.:  [id number]  LOCATION:                                 FACILITY:  PHYSICIAN:  Velora Heckler, MD      DATE OF BIRTH:  1950/03/29  DATE OF PROCEDURE:  01/14/2012                               OPERATIVE REPORT   PREOPERATIVE DIAGNOSIS:  Mass, mid back (3 cm), likely sebaceous cyst.  POSTOPERATIVE DIAGNOSIS:  Mass, mid back (3 cm), likely sebaceous cyst.  PROCEDURE:  Excision mass, mid back (3.0 x 2.0 x 2.0 cm).  SURGEON:  Velora Heckler, MD, FACS  ANESTHESIA:  Local with intravenous sedation.  PREPARATION:  ChloraPrep.  COMPLICATIONS:  None.  INDICATIONS:  The patient is a 61 year old white male who presents with an enlarging mass in the mid back.  This has been present for some time and gradually increasing in size.  It causes minimal discomfort.  It has never been infected.  The patient now presents for surgical excision.  BODY OF REPORT:  Procedure was done in OR #5 at the [facility].  The patient was brought to the operating room, placed in a prone position on the operating room table.  Following administration of intravenous sedation, the patient was prepped and draped in the usual strict aseptic fashion.  After ascertaining that an adequate level of sedation had been achieved, the skin around the mass in the mid back is anesthetized with local anesthetic.  An elliptical incision was made so as to encompass the sinus tract in the central portion.  Dissection was carried full-thickness into the subcutaneous tissues.  Using sharp dissection with Metzenbaum scissors, the mass was excised.  Hemostasis was achieved with the electrocautery.  The entire mass was submitted to Pathology for review.  Good hemostasis was noted.  Wound was closed with interrupted 3-0 Vicryl subcuticular sutures.  Wound was washed and dried and benzoin and Steri- Strips were applied.  Sterile dressings  were applied.  The patient was awakened from anesthesia and brought to the recovery room.  The patient tolerated the procedure well.  Velora Heckler, MD, [facility] Office: [phone]   TMG/MEDQ  D:  01/14/2012  T:  01/15/2012  Job:  [id number]

## 2012-01-16 ENCOUNTER — Telehealth (INDEPENDENT_AMBULATORY_CARE_PROVIDER_SITE_OTHER): Payer: Self-pay

## 2012-01-16 NOTE — Telephone Encounter (Signed)
Pt notified of path result per Dr Gerkin's request. 

## 2012-01-16 NOTE — Progress Notes (Signed)
Quick Note:  Please contact patient and notify of benign pathology results.  Gayanne Prescott M. Javian Nudd, MD, [facility] Office: [phone]   ______ 

## 2012-02-11 ENCOUNTER — Ambulatory Visit (INDEPENDENT_AMBULATORY_CARE_PROVIDER_SITE_OTHER): Payer: Commercial Managed Care - PPO | Admitting: Surgery

## 2012-02-11 ENCOUNTER — Encounter (INDEPENDENT_AMBULATORY_CARE_PROVIDER_SITE_OTHER): Payer: Self-pay | Admitting: Surgery

## 2012-02-11 VITALS — BP 128/80 | HR 72 | Temp 97.4°F | Resp 18 | Ht 69.0 in | Wt 187.0 lb

## 2012-02-11 DIAGNOSIS — L723 Sebaceous cyst: Secondary | ICD-10-CM

## 2012-02-11 NOTE — Patient Instructions (Signed)
  COCOA BUTTER & VITAMIN E CREAM  ([employer] or other brand)  Apply cocoa butter/vitamin E cream to your incision 2 - 3 times daily.  Massage cream into incision for one minute with each application.  Use sunscreen (50 SPF or higher) for first 6 months after surgery if area is exposed to sun.  You may substitute Mederma or other scar reducing creams as desired.   

## 2012-02-11 NOTE — Progress Notes (Signed)
General Surgery [facility]  Visit Diagnoses: 1. Sebaceous cyst, right mid back     HISTORY: The patient returns for postoperative visit having undergone excision of a benign epidermal inclusion cyst from the mid back on 01/14/2012.  EXAM: Incision is well-healed. No sign of seroma. No sign of infection.  IMPRESSION: Status post excision of benign epidermal inclusion cyst from mid back  PLAN: Patient will apply topical creams to the incision. He will return as needed.  Velora Heckler, MD, FACS General & Endocrine Surgery [facility]
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
## 2015-02-07 MED FILL — PRAVASTATIN SODIUM 20 MG TA: 20 | 90 days supply | Qty: 90 | Fill #0

## 2015-05-26 MED FILL — PRAVASTATIN SODIUM 20 MG TA: 20 | 90 days supply | Qty: 90 | Fill #1

## 2015-09-04 DIAGNOSIS — H524 Presbyopia: Secondary | ICD-10-CM | POA: Diagnosis not present

## 2015-09-04 DIAGNOSIS — H52203 Unspecified astigmatism, bilateral: Secondary | ICD-10-CM | POA: Diagnosis not present

## 2015-09-20 MED FILL — PRAVASTATIN SODIUM 20 MG TA: 20 | 90 days supply | Qty: 90 | Fill #2

## 2015-10-30 DIAGNOSIS — M7501 Adhesive capsulitis of right shoulder: Secondary | ICD-10-CM | POA: Diagnosis not present

## 2015-11-13 DIAGNOSIS — M7501 Adhesive capsulitis of right shoulder: Secondary | ICD-10-CM | POA: Diagnosis not present

## 2015-11-16 DIAGNOSIS — M7501 Adhesive capsulitis of right shoulder: Secondary | ICD-10-CM | POA: Diagnosis not present

## 2015-11-22 DIAGNOSIS — M7501 Adhesive capsulitis of right shoulder: Secondary | ICD-10-CM | POA: Diagnosis not present

## 2015-11-24 DIAGNOSIS — M7501 Adhesive capsulitis of right shoulder: Secondary | ICD-10-CM | POA: Diagnosis not present

## 2015-11-27 DIAGNOSIS — M7501 Adhesive capsulitis of right shoulder: Secondary | ICD-10-CM | POA: Diagnosis not present

## 2015-11-28 DIAGNOSIS — M7501 Adhesive capsulitis of right shoulder: Secondary | ICD-10-CM | POA: Diagnosis not present

## 2015-11-30 DIAGNOSIS — M7501 Adhesive capsulitis of right shoulder: Secondary | ICD-10-CM | POA: Diagnosis not present

## 2015-12-05 DIAGNOSIS — M7501 Adhesive capsulitis of right shoulder: Secondary | ICD-10-CM | POA: Diagnosis not present

## 2015-12-08 DIAGNOSIS — M7501 Adhesive capsulitis of right shoulder: Secondary | ICD-10-CM | POA: Diagnosis not present

## 2015-12-15 DIAGNOSIS — M7501 Adhesive capsulitis of right shoulder: Secondary | ICD-10-CM | POA: Diagnosis not present

## 2016-01-11 MED FILL — PRAVASTATIN SODIUM 20 MG TA: 20 | 90 days supply | Qty: 90 | Fill #3

## 2016-01-17 DIAGNOSIS — R8299 Other abnormal findings in urine: Secondary | ICD-10-CM | POA: Diagnosis not present

## 2016-01-17 DIAGNOSIS — Z0000 Encounter for general adult medical examination without abnormal findings: Secondary | ICD-10-CM | POA: Diagnosis not present

## 2016-01-17 DIAGNOSIS — Z125 Encounter for screening for malignant neoplasm of prostate: Secondary | ICD-10-CM | POA: Diagnosis not present

## 2016-01-24 DIAGNOSIS — Z0000 Encounter for general adult medical examination without abnormal findings: Secondary | ICD-10-CM | POA: Diagnosis not present

## 2016-01-24 DIAGNOSIS — E784 Other hyperlipidemia: Secondary | ICD-10-CM | POA: Diagnosis not present

## 2016-01-24 DIAGNOSIS — Z1389 Encounter for screening for other disorder: Secondary | ICD-10-CM | POA: Diagnosis not present

## 2016-01-24 DIAGNOSIS — M25511 Pain in right shoulder: Secondary | ICD-10-CM | POA: Diagnosis not present

## 2016-01-24 DIAGNOSIS — N183 Chronic kidney disease, stage 3 (moderate): Secondary | ICD-10-CM | POA: Diagnosis not present

## 2016-01-24 DIAGNOSIS — Z6828 Body mass index (BMI) 28.0-28.9, adult: Secondary | ICD-10-CM | POA: Diagnosis not present

## 2016-01-31 DIAGNOSIS — Z1212 Encounter for screening for malignant neoplasm of rectum: Secondary | ICD-10-CM | POA: Diagnosis not present

## 2016-09-03 MED FILL — PRAVASTATIN SODIUM 20 MG TA: 20 | 90 days supply | Qty: 90 | Fill #0

## 2016-09-10 DIAGNOSIS — H52203 Unspecified astigmatism, bilateral: Secondary | ICD-10-CM | POA: Diagnosis not present

## 2016-09-10 DIAGNOSIS — H524 Presbyopia: Secondary | ICD-10-CM | POA: Diagnosis not present

## 2016-11-04 DIAGNOSIS — Z23 Encounter for immunization: Secondary | ICD-10-CM | POA: Diagnosis not present

## 2016-12-23 ENCOUNTER — Other Ambulatory Visit: Payer: Self-pay | Admitting: Gastroenterology

## 2016-12-23 DIAGNOSIS — D1801 Hemangioma of skin and subcutaneous tissue: Secondary | ICD-10-CM | POA: Diagnosis not present

## 2016-12-23 DIAGNOSIS — L814 Other melanin hyperpigmentation: Secondary | ICD-10-CM | POA: Diagnosis not present

## 2016-12-23 DIAGNOSIS — R109 Unspecified abdominal pain: Secondary | ICD-10-CM

## 2016-12-23 DIAGNOSIS — R197 Diarrhea, unspecified: Secondary | ICD-10-CM | POA: Diagnosis not present

## 2016-12-23 DIAGNOSIS — L821 Other seborrheic keratosis: Secondary | ICD-10-CM | POA: Diagnosis not present

## 2016-12-23 DIAGNOSIS — D225 Melanocytic nevi of trunk: Secondary | ICD-10-CM | POA: Diagnosis not present

## 2016-12-24 DIAGNOSIS — R197 Diarrhea, unspecified: Secondary | ICD-10-CM | POA: Diagnosis not present

## 2016-12-30 ENCOUNTER — Ambulatory Visit
Admission: RE | Admit: 2016-12-30 | Discharge: 2016-12-30 | Disposition: A | Discharge status: Home / Self Care | Payer: 59 | Source: Ambulatory Visit | Attending: Gastroenterology | Admitting: Gastroenterology

## 2016-12-30 DIAGNOSIS — R109 Unspecified abdominal pain: Secondary | ICD-10-CM

## 2016-12-30 DIAGNOSIS — N281 Cyst of kidney, acquired: Secondary | ICD-10-CM | POA: Diagnosis not present

## 2016-12-30 DIAGNOSIS — R197 Diarrhea, unspecified: Secondary | ICD-10-CM

## 2016-12-30 MED FILL — VANCOMYCIN HCL 125 MG CAP: 125 | 14 days supply | Qty: 56 | Fill #0

## 2017-01-23 DIAGNOSIS — R197 Diarrhea, unspecified: Secondary | ICD-10-CM | POA: Diagnosis not present

## 2017-01-23 DIAGNOSIS — B9689 Other specified bacterial agents as the cause of diseases classified elsewhere: Secondary | ICD-10-CM | POA: Diagnosis not present

## 2017-01-24 DIAGNOSIS — B9689 Other specified bacterial agents as the cause of diseases classified elsewhere: Secondary | ICD-10-CM | POA: Diagnosis not present

## 2017-02-07 DIAGNOSIS — Z0000 Encounter for general adult medical examination without abnormal findings: Secondary | ICD-10-CM | POA: Diagnosis not present

## 2017-02-07 DIAGNOSIS — Z125 Encounter for screening for malignant neoplasm of prostate: Secondary | ICD-10-CM | POA: Diagnosis not present

## 2017-02-07 DIAGNOSIS — R82998 Other abnormal findings in urine: Secondary | ICD-10-CM | POA: Diagnosis not present

## 2017-02-14 DIAGNOSIS — Z1389 Encounter for screening for other disorder: Secondary | ICD-10-CM | POA: Diagnosis not present

## 2017-02-14 DIAGNOSIS — Z6828 Body mass index (BMI) 28.0-28.9, adult: Secondary | ICD-10-CM | POA: Diagnosis not present

## 2017-02-14 DIAGNOSIS — E7849 Other hyperlipidemia: Secondary | ICD-10-CM | POA: Diagnosis not present

## 2017-02-14 DIAGNOSIS — N183 Chronic kidney disease, stage 3 (moderate): Secondary | ICD-10-CM | POA: Diagnosis not present

## 2017-02-14 DIAGNOSIS — Z8619 Personal history of other infectious and parasitic diseases: Secondary | ICD-10-CM | POA: Diagnosis not present

## 2017-02-14 DIAGNOSIS — Z0000 Encounter for general adult medical examination without abnormal findings: Secondary | ICD-10-CM | POA: Diagnosis not present

## 2017-05-14 MED FILL — PRAVASTATIN SODIUM 20 MG TA: 20 | 90 days supply | Qty: 90 | Fill #1

## 2017-07-24 DIAGNOSIS — M79671 Pain in right foot: Secondary | ICD-10-CM | POA: Diagnosis not present

## 2017-09-12 DIAGNOSIS — H2513 Age-related nuclear cataract, bilateral: Secondary | ICD-10-CM | POA: Diagnosis not present

## 2017-09-12 DIAGNOSIS — H25012 Cortical age-related cataract, left eye: Secondary | ICD-10-CM | POA: Diagnosis not present

## 2017-09-12 DIAGNOSIS — H52203 Unspecified astigmatism, bilateral: Secondary | ICD-10-CM | POA: Diagnosis not present

## 2017-09-12 DIAGNOSIS — D3132 Benign neoplasm of left choroid: Secondary | ICD-10-CM | POA: Diagnosis not present

## 2017-10-13 MED FILL — valACYclovir HCL 1 GM TABS: 1 | 15 days supply | Qty: 30 | Fill #0

## 2018-02-16 DIAGNOSIS — Z0000 Encounter for general adult medical examination without abnormal findings: Secondary | ICD-10-CM | POA: Diagnosis not present

## 2018-02-16 DIAGNOSIS — Z125 Encounter for screening for malignant neoplasm of prostate: Secondary | ICD-10-CM | POA: Diagnosis not present

## 2018-02-16 DIAGNOSIS — R82998 Other abnormal findings in urine: Secondary | ICD-10-CM | POA: Diagnosis not present

## 2018-03-04 DIAGNOSIS — Z1212 Encounter for screening for malignant neoplasm of rectum: Secondary | ICD-10-CM | POA: Diagnosis not present

## 2018-03-06 DIAGNOSIS — Z0000 Encounter for general adult medical examination without abnormal findings: Secondary | ICD-10-CM | POA: Diagnosis not present

## 2018-03-06 DIAGNOSIS — N183 Chronic kidney disease, stage 3 (moderate): Secondary | ICD-10-CM | POA: Diagnosis not present

## 2018-03-06 DIAGNOSIS — Z1331 Encounter for screening for depression: Secondary | ICD-10-CM | POA: Diagnosis not present

## 2018-03-06 DIAGNOSIS — Z8619 Personal history of other infectious and parasitic diseases: Secondary | ICD-10-CM | POA: Diagnosis not present

## 2018-03-06 DIAGNOSIS — Z23 Encounter for immunization: Secondary | ICD-10-CM | POA: Diagnosis not present

## 2018-03-06 DIAGNOSIS — E7849 Other hyperlipidemia: Secondary | ICD-10-CM | POA: Diagnosis not present

## 2018-03-06 DIAGNOSIS — E870 Hyperosmolality and hypernatremia: Secondary | ICD-10-CM | POA: Diagnosis not present

## 2018-03-06 DIAGNOSIS — Z6828 Body mass index (BMI) 28.0-28.9, adult: Secondary | ICD-10-CM | POA: Diagnosis not present

## 2018-03-19 IMAGING — US US ABDOMEN COMPLETE
1 series · 14 of 25 positions shown · non-contrast
Comparison: None.

CLINICAL DATA: Right-sided abdominal pain for 2 months.  Diarrhea.

EXAM:
ABDOMEN ULTRASOUND COMPLETE

[Series 1: us abdomen complete · 0.19mm/px · 14 of 78 slices shown]
[im 1/78]
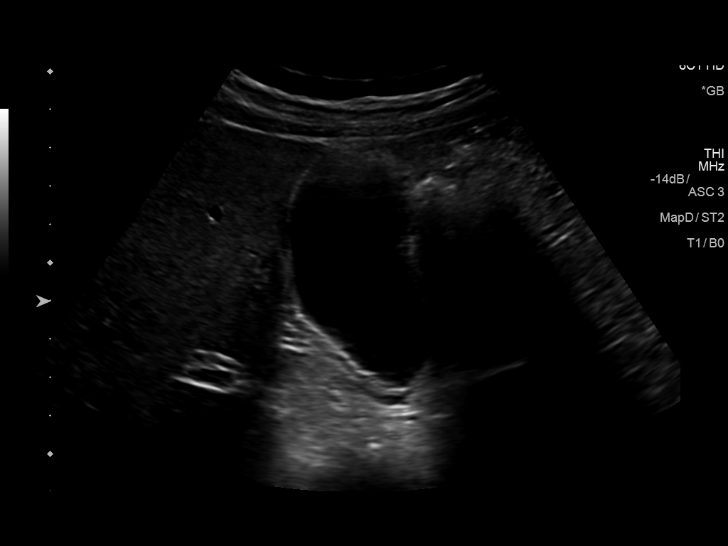
[im 7/78]
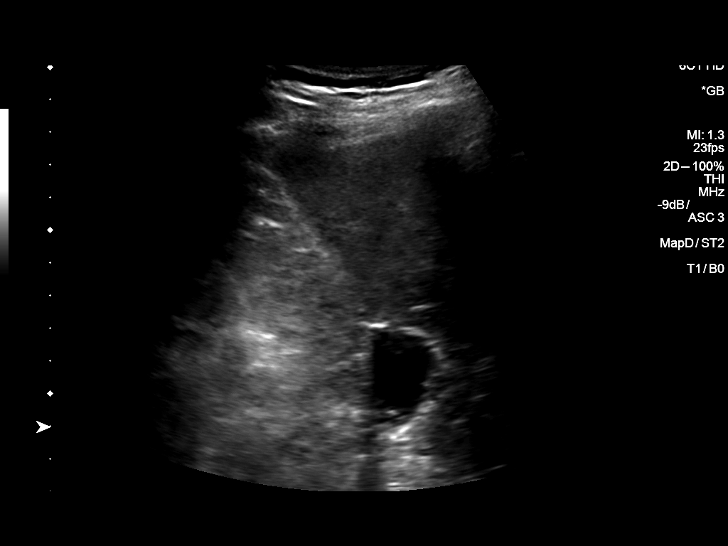
[im 13/78]
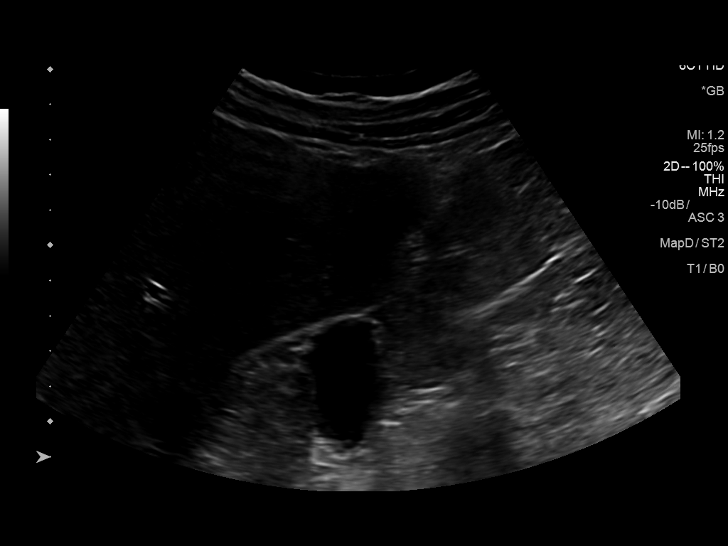
[im 20/78]
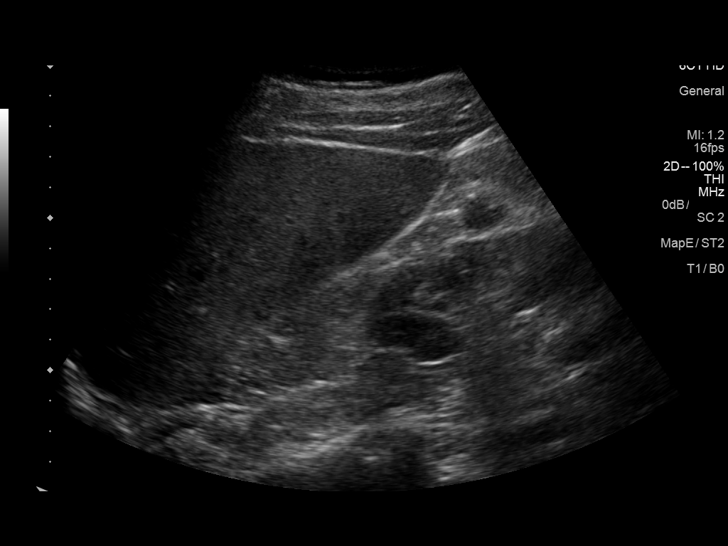
[im 26/78]
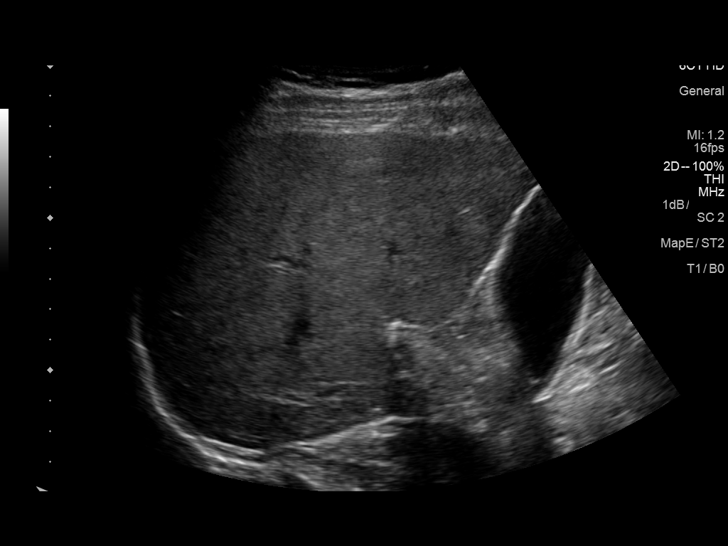
[im 29/78]
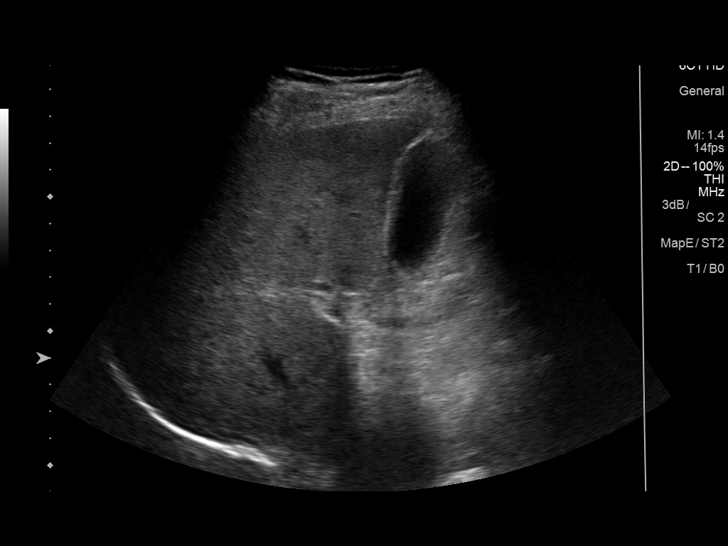
[im 36/78]
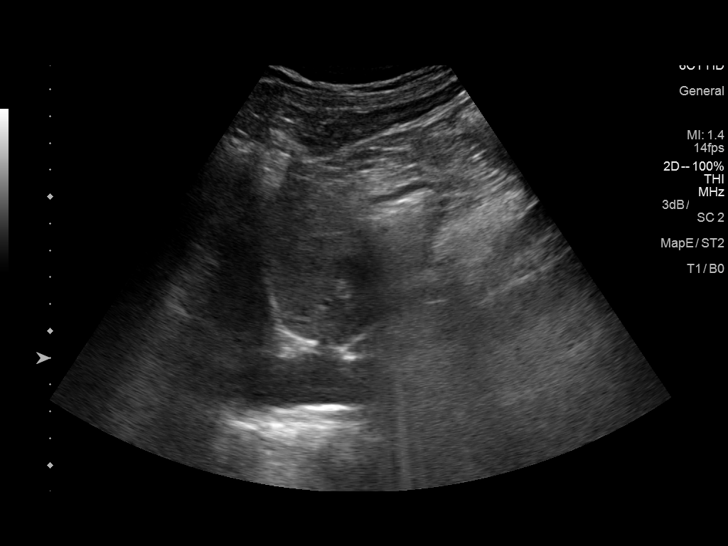
[im 42/78]
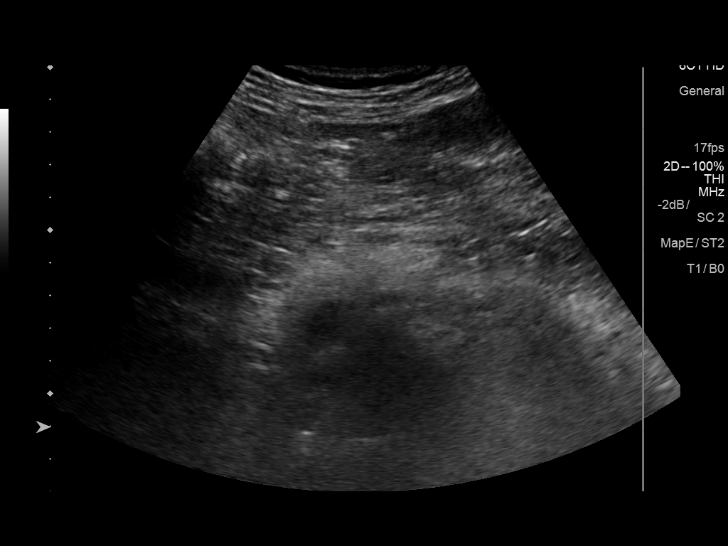
[im 49/78]
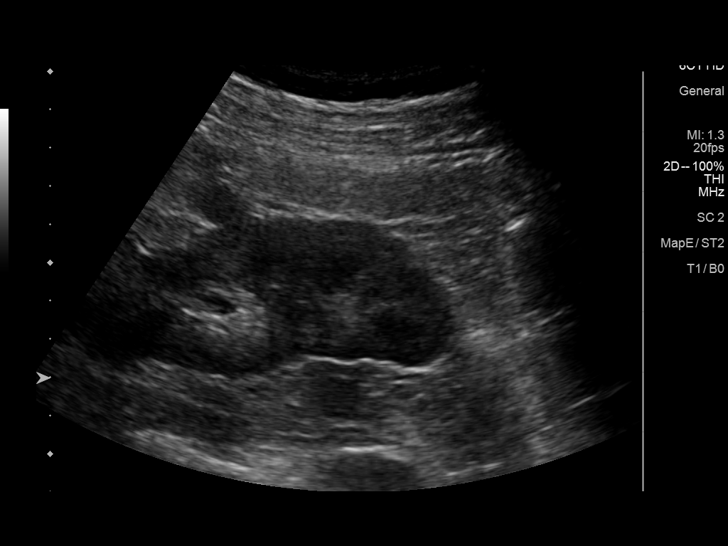
[im 52/78]
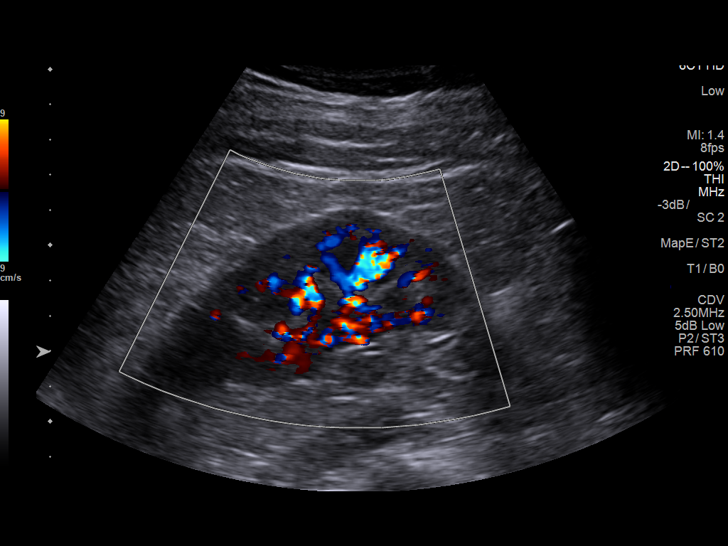
[im 58/78]
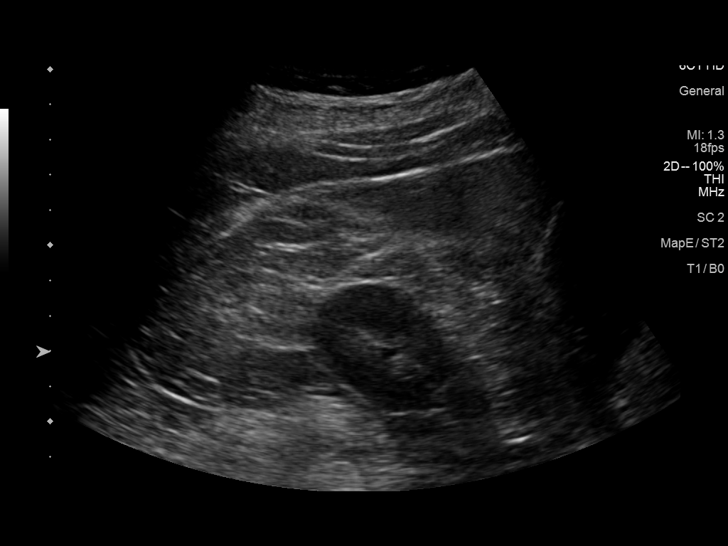
[im 65/78]
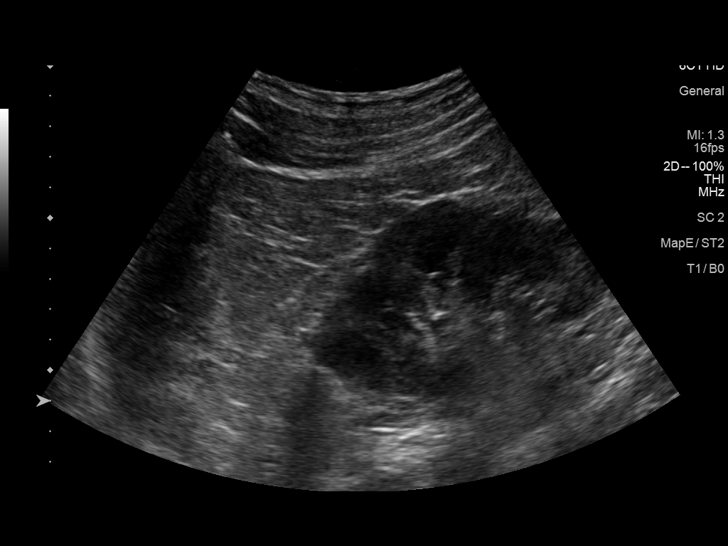
[im 71/78]
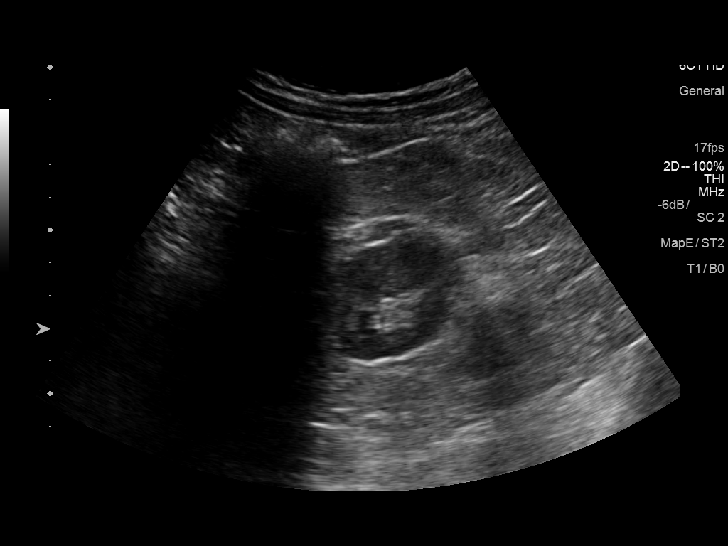
[im 78/78]
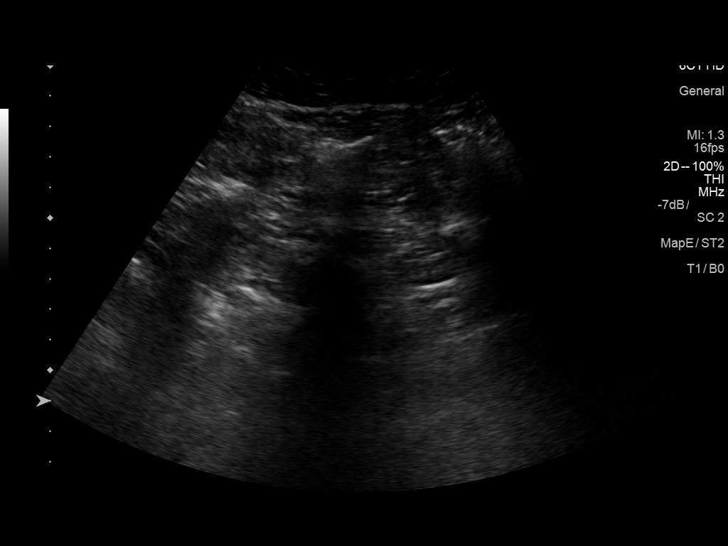

[14 of 25 positions shown; findings below may reference images not displayed]

FINDINGS: Gallbladder: Gallbladder has a normal appearance. Gallbladder wall
is 1.9 mm, within normal limits. No stones or pericholecystic fluid.
No sonographic Murphy's sign.

Common bile duct: Diameter: 2.8 mm

Liver: Mildly heterogeneous without focal liver lesion. Portal vein
is patent on color Doppler imaging with normal direction of blood
flow towards the liver.

IVC: No abnormality visualized.

Pancreas: Visualized portion unremarkable.

Spleen: 10.9 cm

Right Kidney: Length: 10.9 cm. No hydronephrosis. A benign cyst is
identified in the midpole region measuring 1.3 x 1.4 x 1.3 cm.

Left Kidney: Length: 11.2 cm. Echogenicity within normal limits. No
mass or hydronephrosis visualized.

Abdominal aorta: No aneurysm visualized.

Other findings: None.
IMPRESSION: 1. No acute cholecystitis.
2. No focal liver lesions.
3. Benign right renal cyst.  No hydronephrosis.

## 2018-03-20 MED FILL — PRAVASTATIN SODIUM 20 MG TA: 20 | 90 days supply | Qty: 90 | Fill #0

## 2018-04-13 DIAGNOSIS — J209 Acute bronchitis, unspecified: Secondary | ICD-10-CM | POA: Diagnosis not present

## 2018-04-13 DIAGNOSIS — J309 Allergic rhinitis, unspecified: Secondary | ICD-10-CM | POA: Diagnosis not present

## 2018-04-13 DIAGNOSIS — J029 Acute pharyngitis, unspecified: Secondary | ICD-10-CM | POA: Diagnosis not present

## 2018-04-13 MED FILL — AZITHROMYCIN 250 MG TABLET: 250 | 5 days supply | Qty: 6 | Fill #0

## 2018-04-13 MED FILL — predniSONE 10 MG TABS: 10 | 6 days supply | Qty: 14 | Fill #0

## 2018-04-13 MED FILL — BENZONATATE 200 MG CAPS: 200 | 10 days supply | Qty: 30 | Fill #0

## 2018-06-12 MED FILL — PRAVASTATIN SODIUM 20 MG TA: 20 | 90 days supply | Qty: 90 | Fill #0

## 2018-09-14 DIAGNOSIS — H52203 Unspecified astigmatism, bilateral: Secondary | ICD-10-CM | POA: Diagnosis not present

## 2018-09-14 DIAGNOSIS — H524 Presbyopia: Secondary | ICD-10-CM | POA: Diagnosis not present

## 2018-09-17 MED FILL — PRAVASTATIN SODIUM 20 MG TA: 20 | 90 days supply | Qty: 90 | Fill #0

## 2018-09-18 DIAGNOSIS — L821 Other seborrheic keratosis: Secondary | ICD-10-CM | POA: Diagnosis not present

## 2018-09-18 DIAGNOSIS — D224 Melanocytic nevi of scalp and neck: Secondary | ICD-10-CM | POA: Diagnosis not present

## 2018-09-18 DIAGNOSIS — D225 Melanocytic nevi of trunk: Secondary | ICD-10-CM | POA: Diagnosis not present

## 2018-09-18 DIAGNOSIS — D1801 Hemangioma of skin and subcutaneous tissue: Secondary | ICD-10-CM | POA: Diagnosis not present

## 2018-09-18 DIAGNOSIS — D485 Neoplasm of uncertain behavior of skin: Secondary | ICD-10-CM | POA: Diagnosis not present

## 2018-10-29 DIAGNOSIS — Z23 Encounter for immunization: Secondary | ICD-10-CM | POA: Diagnosis not present

## 2018-12-08 MED FILL — PRAVASTATIN SODIUM 20 MG TA: 20 | 90 days supply | Qty: 90 | Fill #0

## 2019-01-15 DIAGNOSIS — S62615A Displaced fracture of proximal phalanx of left ring finger, initial encounter for closed fracture: Secondary | ICD-10-CM | POA: Diagnosis not present

## 2019-01-15 DIAGNOSIS — M79642 Pain in left hand: Secondary | ICD-10-CM | POA: Diagnosis not present

## 2019-01-15 DIAGNOSIS — M79644 Pain in right finger(s): Secondary | ICD-10-CM | POA: Diagnosis not present

## 2019-01-15 DIAGNOSIS — S62617A Displaced fracture of proximal phalanx of left little finger, initial encounter for closed fracture: Secondary | ICD-10-CM | POA: Diagnosis not present

## 2019-01-19 DIAGNOSIS — S62645D Nondisplaced fracture of proximal phalanx of left ring finger, subsequent encounter for fracture with routine healing: Secondary | ICD-10-CM | POA: Diagnosis not present

## 2019-01-19 DIAGNOSIS — S62647A Nondisplaced fracture of proximal phalanx of left little finger, initial encounter for closed fracture: Secondary | ICD-10-CM | POA: Diagnosis not present

## 2019-01-22 DIAGNOSIS — S62647A Nondisplaced fracture of proximal phalanx of left little finger, initial encounter for closed fracture: Secondary | ICD-10-CM | POA: Diagnosis not present

## 2019-01-22 DIAGNOSIS — S62645A Nondisplaced fracture of proximal phalanx of left ring finger, initial encounter for closed fracture: Secondary | ICD-10-CM | POA: Diagnosis not present

## 2019-01-22 DIAGNOSIS — S62617A Displaced fracture of proximal phalanx of left little finger, initial encounter for closed fracture: Secondary | ICD-10-CM | POA: Diagnosis not present

## 2019-01-22 DIAGNOSIS — S62615A Displaced fracture of proximal phalanx of left ring finger, initial encounter for closed fracture: Secondary | ICD-10-CM | POA: Diagnosis not present

## 2019-01-22 MED FILL — HYDROCODON-APAP 5-325: 5-325 | 7 days supply | Qty: 21 | Fill #0

## 2019-02-03 DIAGNOSIS — S62617D Displaced fracture of proximal phalanx of left little finger, subsequent encounter for fracture with routine healing: Secondary | ICD-10-CM | POA: Diagnosis not present

## 2019-02-03 DIAGNOSIS — S62615D Displaced fracture of proximal phalanx of left ring finger, subsequent encounter for fracture with routine healing: Secondary | ICD-10-CM | POA: Diagnosis not present

## 2019-02-03 DIAGNOSIS — M25642 Stiffness of left hand, not elsewhere classified: Secondary | ICD-10-CM | POA: Diagnosis not present

## 2019-02-03 DIAGNOSIS — Z4789 Encounter for other orthopedic aftercare: Secondary | ICD-10-CM | POA: Diagnosis not present

## 2019-02-16 DIAGNOSIS — M25642 Stiffness of left hand, not elsewhere classified: Secondary | ICD-10-CM | POA: Diagnosis not present

## 2019-02-16 DIAGNOSIS — Z4789 Encounter for other orthopedic aftercare: Secondary | ICD-10-CM | POA: Diagnosis not present

## 2019-02-19 DIAGNOSIS — M25642 Stiffness of left hand, not elsewhere classified: Secondary | ICD-10-CM | POA: Diagnosis not present

## 2019-02-23 DIAGNOSIS — M25642 Stiffness of left hand, not elsewhere classified: Secondary | ICD-10-CM | POA: Diagnosis not present

## 2019-02-24 ENCOUNTER — Ambulatory Visit: Payer: 59

## 2019-02-26 DIAGNOSIS — M25642 Stiffness of left hand, not elsewhere classified: Secondary | ICD-10-CM | POA: Diagnosis not present

## 2019-03-02 DIAGNOSIS — M25642 Stiffness of left hand, not elsewhere classified: Secondary | ICD-10-CM | POA: Diagnosis not present

## 2019-03-04 DIAGNOSIS — M25642 Stiffness of left hand, not elsewhere classified: Secondary | ICD-10-CM | POA: Diagnosis not present

## 2019-03-09 DIAGNOSIS — Z4789 Encounter for other orthopedic aftercare: Secondary | ICD-10-CM | POA: Diagnosis not present

## 2019-03-09 DIAGNOSIS — S62647A Nondisplaced fracture of proximal phalanx of left little finger, initial encounter for closed fracture: Secondary | ICD-10-CM | POA: Diagnosis not present

## 2019-03-10 DIAGNOSIS — R82998 Other abnormal findings in urine: Secondary | ICD-10-CM | POA: Diagnosis not present

## 2019-03-10 DIAGNOSIS — E7849 Other hyperlipidemia: Secondary | ICD-10-CM | POA: Diagnosis not present

## 2019-03-10 DIAGNOSIS — M25642 Stiffness of left hand, not elsewhere classified: Secondary | ICD-10-CM | POA: Diagnosis not present

## 2019-03-10 DIAGNOSIS — Z125 Encounter for screening for malignant neoplasm of prostate: Secondary | ICD-10-CM | POA: Diagnosis not present

## 2019-03-11 DIAGNOSIS — Z1212 Encounter for screening for malignant neoplasm of rectum: Secondary | ICD-10-CM | POA: Diagnosis not present

## 2019-03-12 DIAGNOSIS — M25642 Stiffness of left hand, not elsewhere classified: Secondary | ICD-10-CM | POA: Diagnosis not present

## 2019-03-13 ENCOUNTER — Ambulatory Visit: Payer: PPO | Attending: Internal Medicine

## 2019-03-13 DIAGNOSIS — Z23 Encounter for immunization: Secondary | ICD-10-CM

## 2019-03-13 NOTE — Progress Notes (Signed)
   Covid-19 Vaccination Clinic  Name:  Aaron Brady    MRN: [id number] DOB: [birth year]  03/13/2019  [name] was observed post Covid-19 immunization for 15 minutes without incidence. He was provided with Vaccine Information Sheet and instruction to access the V-Safe system.   [name] was instructed to call 911 with any severe reactions post vaccine: Marland Kitchen Difficulty breathing  . Swelling of your face and throat  . A fast heartbeat  . A bad rash all over your body  . Dizziness and weakness    Immunizations Administered    Name Date Dose VIS Date Route   Pfizer COVID-19 Vaccine 03/13/2019  8:35 AM 0.3 mL 01/15/2019 Intramuscular   Manufacturer: [place]   Lot: [id number]   [place]: [id number]

## 2019-03-15 DIAGNOSIS — M25642 Stiffness of left hand, not elsewhere classified: Secondary | ICD-10-CM | POA: Diagnosis not present

## 2019-03-16 MED FILL — PRAVASTATIN SODIUM 20 MG TA: 20 | 90 days supply | Qty: 90 | Fill #1

## 2019-03-17 DIAGNOSIS — S62605S Fracture of unspecified phalanx of left ring finger, sequela: Secondary | ICD-10-CM | POA: Diagnosis not present

## 2019-03-17 DIAGNOSIS — E785 Hyperlipidemia, unspecified: Secondary | ICD-10-CM | POA: Diagnosis not present

## 2019-03-17 DIAGNOSIS — Z0000 Encounter for general adult medical examination without abnormal findings: Secondary | ICD-10-CM | POA: Diagnosis not present

## 2019-03-19 DIAGNOSIS — M25642 Stiffness of left hand, not elsewhere classified: Secondary | ICD-10-CM | POA: Diagnosis not present

## 2019-03-24 DIAGNOSIS — M25642 Stiffness of left hand, not elsewhere classified: Secondary | ICD-10-CM | POA: Diagnosis not present

## 2019-03-25 ENCOUNTER — Ambulatory Visit: Payer: 59

## 2019-03-26 DIAGNOSIS — M25642 Stiffness of left hand, not elsewhere classified: Secondary | ICD-10-CM | POA: Diagnosis not present

## 2019-03-31 DIAGNOSIS — M25642 Stiffness of left hand, not elsewhere classified: Secondary | ICD-10-CM | POA: Diagnosis not present

## 2019-04-02 DIAGNOSIS — M25642 Stiffness of left hand, not elsewhere classified: Secondary | ICD-10-CM | POA: Diagnosis not present

## 2019-04-06 ENCOUNTER — Ambulatory Visit: Payer: HMO

## 2019-04-06 ENCOUNTER — Ambulatory Visit: Payer: HMO | Attending: Internal Medicine

## 2019-04-06 DIAGNOSIS — Z23 Encounter for immunization: Secondary | ICD-10-CM | POA: Insufficient documentation

## 2019-04-06 DIAGNOSIS — Z4789 Encounter for other orthopedic aftercare: Secondary | ICD-10-CM | POA: Diagnosis not present

## 2019-04-06 DIAGNOSIS — S62647A Nondisplaced fracture of proximal phalanx of left little finger, initial encounter for closed fracture: Secondary | ICD-10-CM | POA: Diagnosis not present

## 2019-04-06 NOTE — Progress Notes (Signed)
   Covid-19 Vaccination Clinic  Name:  Aaron Brady    MRN: [id number] DOB: 09-05-1950  04/06/2019  [name] was observed post Covid-19 immunization for 15 minutes without incident. He was provided with Vaccine Information Sheet and instruction to access the V-Safe system.   [name] was instructed to call 911 with any severe reactions post vaccine: Marland Kitchen Difficulty breathing  . Swelling of face and throat  . A fast heartbeat  . A bad rash all over body  . Dizziness and weakness   Immunizations Administered    Name Date Dose VIS Date Route   Pfizer COVID-19 Vaccine 04/06/2019  2:34 PM 0.3 mL 01/15/2019 Intramuscular   Manufacturer: [place]   Lot: [id number]   [place]: [id number]

## 2019-04-07 DIAGNOSIS — M25642 Stiffness of left hand, not elsewhere classified: Secondary | ICD-10-CM | POA: Diagnosis not present

## 2019-04-09 DIAGNOSIS — M25642 Stiffness of left hand, not elsewhere classified: Secondary | ICD-10-CM | POA: Diagnosis not present

## 2019-04-14 DIAGNOSIS — M25642 Stiffness of left hand, not elsewhere classified: Secondary | ICD-10-CM | POA: Diagnosis not present

## 2019-04-16 DIAGNOSIS — M25642 Stiffness of left hand, not elsewhere classified: Secondary | ICD-10-CM | POA: Diagnosis not present

## 2019-04-20 DIAGNOSIS — M25642 Stiffness of left hand, not elsewhere classified: Secondary | ICD-10-CM | POA: Diagnosis not present

## 2019-04-23 DIAGNOSIS — M25642 Stiffness of left hand, not elsewhere classified: Secondary | ICD-10-CM | POA: Diagnosis not present

## 2019-04-27 DIAGNOSIS — M25642 Stiffness of left hand, not elsewhere classified: Secondary | ICD-10-CM | POA: Diagnosis not present

## 2019-04-30 DIAGNOSIS — M25642 Stiffness of left hand, not elsewhere classified: Secondary | ICD-10-CM | POA: Diagnosis not present

## 2019-05-04 DIAGNOSIS — M25642 Stiffness of left hand, not elsewhere classified: Secondary | ICD-10-CM | POA: Diagnosis not present

## 2019-05-06 DIAGNOSIS — M25642 Stiffness of left hand, not elsewhere classified: Secondary | ICD-10-CM | POA: Diagnosis not present

## 2019-05-06 DIAGNOSIS — S62647A Nondisplaced fracture of proximal phalanx of left little finger, initial encounter for closed fracture: Secondary | ICD-10-CM | POA: Diagnosis not present

## 2019-05-06 DIAGNOSIS — S62645D Nondisplaced fracture of proximal phalanx of left ring finger, subsequent encounter for fracture with routine healing: Secondary | ICD-10-CM | POA: Diagnosis not present

## 2019-05-10 DIAGNOSIS — M25642 Stiffness of left hand, not elsewhere classified: Secondary | ICD-10-CM | POA: Diagnosis not present

## 2019-05-13 DIAGNOSIS — M25642 Stiffness of left hand, not elsewhere classified: Secondary | ICD-10-CM | POA: Diagnosis not present

## 2019-05-17 DIAGNOSIS — M25642 Stiffness of left hand, not elsewhere classified: Secondary | ICD-10-CM | POA: Diagnosis not present

## 2019-05-21 DIAGNOSIS — M25642 Stiffness of left hand, not elsewhere classified: Secondary | ICD-10-CM | POA: Diagnosis not present

## 2019-05-24 DIAGNOSIS — M25642 Stiffness of left hand, not elsewhere classified: Secondary | ICD-10-CM | POA: Diagnosis not present

## 2019-06-14 DIAGNOSIS — M25642 Stiffness of left hand, not elsewhere classified: Secondary | ICD-10-CM | POA: Diagnosis not present

## 2019-06-15 DIAGNOSIS — S62645D Nondisplaced fracture of proximal phalanx of left ring finger, subsequent encounter for fracture with routine healing: Secondary | ICD-10-CM | POA: Diagnosis not present

## 2019-06-15 DIAGNOSIS — S62616D Displaced fracture of proximal phalanx of right little finger, subsequent encounter for fracture with routine healing: Secondary | ICD-10-CM | POA: Diagnosis not present

## 2019-06-21 DIAGNOSIS — M25642 Stiffness of left hand, not elsewhere classified: Secondary | ICD-10-CM | POA: Diagnosis not present

## 2019-06-23 DIAGNOSIS — M25642 Stiffness of left hand, not elsewhere classified: Secondary | ICD-10-CM | POA: Diagnosis not present

## 2019-06-25 MED FILL — PRAVASTATIN SODIUM 20 MG TA: 20 | 90 days supply | Qty: 90 | Fill #2

## 2019-06-28 DIAGNOSIS — M25642 Stiffness of left hand, not elsewhere classified: Secondary | ICD-10-CM | POA: Diagnosis not present

## 2019-07-02 DIAGNOSIS — M25642 Stiffness of left hand, not elsewhere classified: Secondary | ICD-10-CM | POA: Diagnosis not present

## 2019-07-27 DIAGNOSIS — S62645D Nondisplaced fracture of proximal phalanx of left ring finger, subsequent encounter for fracture with routine healing: Secondary | ICD-10-CM | POA: Diagnosis not present

## 2019-07-27 DIAGNOSIS — S62616D Displaced fracture of proximal phalanx of right little finger, subsequent encounter for fracture with routine healing: Secondary | ICD-10-CM | POA: Diagnosis not present

## 2019-09-21 DIAGNOSIS — H43813 Vitreous degeneration, bilateral: Secondary | ICD-10-CM | POA: Diagnosis not present

## 2019-09-21 DIAGNOSIS — D3132 Benign neoplasm of left choroid: Secondary | ICD-10-CM | POA: Diagnosis not present

## 2019-09-21 DIAGNOSIS — H524 Presbyopia: Secondary | ICD-10-CM | POA: Diagnosis not present

## 2019-09-21 DIAGNOSIS — H2513 Age-related nuclear cataract, bilateral: Secondary | ICD-10-CM | POA: Diagnosis not present

## 2019-10-20 DIAGNOSIS — D2271 Melanocytic nevi of right lower limb, including hip: Secondary | ICD-10-CM | POA: Diagnosis not present

## 2019-10-20 DIAGNOSIS — D225 Melanocytic nevi of trunk: Secondary | ICD-10-CM | POA: Diagnosis not present

## 2019-10-20 DIAGNOSIS — L812 Freckles: Secondary | ICD-10-CM | POA: Diagnosis not present

## 2019-10-20 DIAGNOSIS — D1801 Hemangioma of skin and subcutaneous tissue: Secondary | ICD-10-CM | POA: Diagnosis not present

## 2019-10-20 DIAGNOSIS — D2272 Melanocytic nevi of left lower limb, including hip: Secondary | ICD-10-CM | POA: Diagnosis not present

## 2019-10-26 MED FILL — PRAVASTATIN SODIUM 20 MG TA: 20 | 90 days supply | Qty: 90 | Fill #3

## 2020-02-09 ENCOUNTER — Other Ambulatory Visit (HOSPITAL_COMMUNITY): Payer: Self-pay | Admitting: Internal Medicine

## 2020-02-09 MED FILL — PRAVASTATIN SODIUM 20 MG TA: 20 | 90 days supply | Qty: 90 | Fill #0

## 2020-03-20 DIAGNOSIS — E785 Hyperlipidemia, unspecified: Secondary | ICD-10-CM | POA: Diagnosis not present

## 2020-03-20 DIAGNOSIS — Z125 Encounter for screening for malignant neoplasm of prostate: Secondary | ICD-10-CM | POA: Diagnosis not present

## 2020-03-27 DIAGNOSIS — R82998 Other abnormal findings in urine: Secondary | ICD-10-CM | POA: Diagnosis not present

## 2020-04-17 DIAGNOSIS — Z0000 Encounter for general adult medical examination without abnormal findings: Secondary | ICD-10-CM | POA: Diagnosis not present

## 2020-04-17 DIAGNOSIS — Z1339 Encounter for screening examination for other mental health and behavioral disorders: Secondary | ICD-10-CM | POA: Diagnosis not present

## 2020-04-17 DIAGNOSIS — Z1331 Encounter for screening for depression: Secondary | ICD-10-CM | POA: Diagnosis not present

## 2020-04-17 DIAGNOSIS — E785 Hyperlipidemia, unspecified: Secondary | ICD-10-CM | POA: Diagnosis not present

## 2020-04-17 DIAGNOSIS — N1831 Chronic kidney disease, stage 3a: Secondary | ICD-10-CM | POA: Diagnosis not present

## 2020-05-30 MED FILL — Pravastatin Sodium Tab 20 MG: ORAL | 90 days supply | Qty: 90 | Fill #0 | Status: AC

## 2020-05-31 ENCOUNTER — Other Ambulatory Visit (HOSPITAL_COMMUNITY): Payer: Self-pay

## 2020-09-07 DIAGNOSIS — Z23 Encounter for immunization: Secondary | ICD-10-CM | POA: Diagnosis not present

## 2020-09-20 MED FILL — Pravastatin Sodium Tab 20 MG: ORAL | 90 days supply | Qty: 90 | Fill #1 | Status: AC

## 2020-09-21 ENCOUNTER — Other Ambulatory Visit (HOSPITAL_COMMUNITY): Payer: Self-pay

## 2020-09-22 ENCOUNTER — Other Ambulatory Visit (HOSPITAL_COMMUNITY): Payer: Self-pay

## 2020-09-25 DIAGNOSIS — H25012 Cortical age-related cataract, left eye: Secondary | ICD-10-CM | POA: Diagnosis not present

## 2020-09-25 DIAGNOSIS — H2513 Age-related nuclear cataract, bilateral: Secondary | ICD-10-CM | POA: Diagnosis not present

## 2020-09-25 DIAGNOSIS — H43813 Vitreous degeneration, bilateral: Secondary | ICD-10-CM | POA: Diagnosis not present

## 2020-09-25 DIAGNOSIS — D3132 Benign neoplasm of left choroid: Secondary | ICD-10-CM | POA: Diagnosis not present

## 2020-10-24 DIAGNOSIS — L738 Other specified follicular disorders: Secondary | ICD-10-CM | POA: Diagnosis not present

## 2020-10-24 DIAGNOSIS — L821 Other seborrheic keratosis: Secondary | ICD-10-CM | POA: Diagnosis not present

## 2020-10-24 DIAGNOSIS — D1801 Hemangioma of skin and subcutaneous tissue: Secondary | ICD-10-CM | POA: Diagnosis not present

## 2020-10-24 DIAGNOSIS — L812 Freckles: Secondary | ICD-10-CM | POA: Diagnosis not present

## 2020-11-11 DIAGNOSIS — Z23 Encounter for immunization: Secondary | ICD-10-CM | POA: Diagnosis not present

## 2020-11-28 ENCOUNTER — Other Ambulatory Visit (HOSPITAL_COMMUNITY): Payer: Self-pay

## 2020-11-28 DIAGNOSIS — U071 COVID-19: Secondary | ICD-10-CM | POA: Diagnosis not present

## 2020-11-28 DIAGNOSIS — Z1152 Encounter for screening for COVID-19: Secondary | ICD-10-CM | POA: Diagnosis not present

## 2020-11-28 DIAGNOSIS — R5081 Fever presenting with conditions classified elsewhere: Secondary | ICD-10-CM | POA: Diagnosis not present

## 2020-11-28 DIAGNOSIS — R5383 Other fatigue: Secondary | ICD-10-CM | POA: Diagnosis not present

## 2020-11-28 DIAGNOSIS — R0981 Nasal congestion: Secondary | ICD-10-CM | POA: Diagnosis not present

## 2020-11-28 DIAGNOSIS — R051 Acute cough: Secondary | ICD-10-CM | POA: Diagnosis not present

## 2020-11-28 MED ORDER — LAGEVRIO 200 MG PO CAPS
4.0000 | ORAL_CAPSULE | Freq: Two times a day (BID) | ORAL | 0 refills | Status: AC
  Filled 2020-11-28: qty 40, 5d supply, fill #0

## 2020-11-28 MED ORDER — HYDROCODONE BIT-HOMATROP MBR 5-1.5 MG/5ML PO SOLN
5.0000 mL | Freq: Four times a day (QID) | ORAL | 0 refills | Status: AC | PRN
  Filled 2020-11-28: qty 140, 7d supply, fill #0

## 2021-01-25 MED FILL — Pravastatin Sodium Tab 20 MG: ORAL | 90 days supply | Qty: 90 | Fill #2 | Status: AC

## 2021-01-26 ENCOUNTER — Other Ambulatory Visit (HOSPITAL_COMMUNITY): Payer: Self-pay

## 2021-03-29 ENCOUNTER — Other Ambulatory Visit (HOSPITAL_COMMUNITY): Payer: Self-pay

## 2021-03-29 DIAGNOSIS — L03032 Cellulitis of left toe: Secondary | ICD-10-CM | POA: Diagnosis not present

## 2021-03-29 MED ORDER — MUPIROCIN 2 % EX OINT
TOPICAL_OINTMENT | Freq: Every day | CUTANEOUS | 1 refills | Status: AC
  Filled 2021-03-29: qty 22, 7d supply, fill #0

## 2021-03-29 MED ORDER — DOXYCYCLINE HYCLATE 100 MG PO CAPS
100.0000 mg | ORAL_CAPSULE | Freq: Two times a day (BID) | ORAL | 0 refills | Status: AC
  Filled 2021-03-29: qty 20, 10d supply, fill #0

## 2021-04-26 DIAGNOSIS — E785 Hyperlipidemia, unspecified: Secondary | ICD-10-CM | POA: Diagnosis not present

## 2021-04-26 DIAGNOSIS — Z125 Encounter for screening for malignant neoplasm of prostate: Secondary | ICD-10-CM | POA: Diagnosis not present

## 2021-04-26 DIAGNOSIS — Z79899 Other long term (current) drug therapy: Secondary | ICD-10-CM | POA: Diagnosis not present

## 2021-05-04 DIAGNOSIS — Z1331 Encounter for screening for depression: Secondary | ICD-10-CM | POA: Diagnosis not present

## 2021-05-04 DIAGNOSIS — R600 Localized edema: Secondary | ICD-10-CM | POA: Diagnosis not present

## 2021-05-04 DIAGNOSIS — Z1339 Encounter for screening examination for other mental health and behavioral disorders: Secondary | ICD-10-CM | POA: Diagnosis not present

## 2021-05-04 DIAGNOSIS — E785 Hyperlipidemia, unspecified: Secondary | ICD-10-CM | POA: Diagnosis not present

## 2021-05-04 DIAGNOSIS — N1831 Chronic kidney disease, stage 3a: Secondary | ICD-10-CM | POA: Diagnosis not present

## 2021-05-04 DIAGNOSIS — Z0000 Encounter for general adult medical examination without abnormal findings: Secondary | ICD-10-CM | POA: Diagnosis not present

## 2021-09-26 DIAGNOSIS — H2513 Age-related nuclear cataract, bilateral: Secondary | ICD-10-CM | POA: Diagnosis not present

## 2021-09-26 DIAGNOSIS — H25012 Cortical age-related cataract, left eye: Secondary | ICD-10-CM | POA: Diagnosis not present

## 2021-09-26 DIAGNOSIS — H52203 Unspecified astigmatism, bilateral: Secondary | ICD-10-CM | POA: Diagnosis not present

## 2021-09-26 DIAGNOSIS — H524 Presbyopia: Secondary | ICD-10-CM | POA: Diagnosis not present

## 2021-09-26 DIAGNOSIS — D3132 Benign neoplasm of left choroid: Secondary | ICD-10-CM | POA: Diagnosis not present

## 2021-10-23 DIAGNOSIS — D225 Melanocytic nevi of trunk: Secondary | ICD-10-CM | POA: Diagnosis not present

## 2021-10-23 DIAGNOSIS — D1801 Hemangioma of skin and subcutaneous tissue: Secondary | ICD-10-CM | POA: Diagnosis not present

## 2021-10-23 DIAGNOSIS — L821 Other seborrheic keratosis: Secondary | ICD-10-CM | POA: Diagnosis not present

## 2021-10-23 DIAGNOSIS — L738 Other specified follicular disorders: Secondary | ICD-10-CM | POA: Diagnosis not present

## 2021-12-03 ENCOUNTER — Other Ambulatory Visit (HOSPITAL_COMMUNITY): Payer: Self-pay

## 2021-12-04 ENCOUNTER — Other Ambulatory Visit (HOSPITAL_COMMUNITY): Payer: Self-pay

## 2021-12-06 ENCOUNTER — Other Ambulatory Visit (HOSPITAL_COMMUNITY): Payer: Self-pay

## 2021-12-07 ENCOUNTER — Other Ambulatory Visit (HOSPITAL_COMMUNITY): Payer: Self-pay

## 2021-12-07 MED ORDER — PRAVASTATIN SODIUM 20 MG PO TABS
20.0000 mg | ORAL_TABLET | Freq: Every evening | ORAL | 4 refills | Status: DC
  Filled 2021-12-07: qty 90, 90d supply, fill #0
  Filled 2022-06-25: qty 90, 90d supply, fill #1
  Filled 2022-09-09: qty 90, 90d supply, fill #2

## 2021-12-11 DIAGNOSIS — Z23 Encounter for immunization: Secondary | ICD-10-CM | POA: Diagnosis not present

## 2022-02-11 ENCOUNTER — Other Ambulatory Visit (HOSPITAL_COMMUNITY): Payer: Self-pay

## 2022-02-11 DIAGNOSIS — R0981 Nasal congestion: Secondary | ICD-10-CM | POA: Diagnosis not present

## 2022-02-11 DIAGNOSIS — R051 Acute cough: Secondary | ICD-10-CM | POA: Diagnosis not present

## 2022-02-11 DIAGNOSIS — J069 Acute upper respiratory infection, unspecified: Secondary | ICD-10-CM | POA: Diagnosis not present

## 2022-02-11 DIAGNOSIS — R5383 Other fatigue: Secondary | ICD-10-CM | POA: Diagnosis not present

## 2022-02-11 MED ORDER — LEVOFLOXACIN 500 MG PO TABS
500.0000 mg | ORAL_TABLET | Freq: Every day | ORAL | 0 refills | Status: AC
  Filled 2022-02-11: qty 7, 7d supply, fill #0

## 2022-05-20 DIAGNOSIS — E785 Hyperlipidemia, unspecified: Secondary | ICD-10-CM | POA: Diagnosis not present

## 2022-05-20 DIAGNOSIS — R7989 Other specified abnormal findings of blood chemistry: Secondary | ICD-10-CM | POA: Diagnosis not present

## 2022-05-20 DIAGNOSIS — Z125 Encounter for screening for malignant neoplasm of prostate: Secondary | ICD-10-CM | POA: Diagnosis not present

## 2022-05-27 DIAGNOSIS — R053 Chronic cough: Secondary | ICD-10-CM | POA: Diagnosis not present

## 2022-05-27 DIAGNOSIS — Z1339 Encounter for screening examination for other mental health and behavioral disorders: Secondary | ICD-10-CM | POA: Diagnosis not present

## 2022-05-27 DIAGNOSIS — E785 Hyperlipidemia, unspecified: Secondary | ICD-10-CM | POA: Diagnosis not present

## 2022-05-27 DIAGNOSIS — N1831 Chronic kidney disease, stage 3a: Secondary | ICD-10-CM | POA: Diagnosis not present

## 2022-05-27 DIAGNOSIS — Z0000 Encounter for general adult medical examination without abnormal findings: Secondary | ICD-10-CM | POA: Diagnosis not present

## 2022-05-27 DIAGNOSIS — R600 Localized edema: Secondary | ICD-10-CM | POA: Diagnosis not present

## 2022-05-27 DIAGNOSIS — M79642 Pain in left hand: Secondary | ICD-10-CM | POA: Diagnosis not present

## 2022-05-27 DIAGNOSIS — Z1331 Encounter for screening for depression: Secondary | ICD-10-CM | POA: Diagnosis not present

## 2022-06-11 ENCOUNTER — Other Ambulatory Visit (HOSPITAL_COMMUNITY): Payer: Self-pay

## 2022-06-25 ENCOUNTER — Other Ambulatory Visit: Payer: Self-pay

## 2022-09-09 ENCOUNTER — Other Ambulatory Visit (HOSPITAL_COMMUNITY): Payer: Self-pay

## 2022-09-27 DIAGNOSIS — D3132 Benign neoplasm of left choroid: Secondary | ICD-10-CM | POA: Diagnosis not present

## 2022-09-27 DIAGNOSIS — H52203 Unspecified astigmatism, bilateral: Secondary | ICD-10-CM | POA: Diagnosis not present

## 2022-09-27 DIAGNOSIS — H2513 Age-related nuclear cataract, bilateral: Secondary | ICD-10-CM | POA: Diagnosis not present

## 2022-09-27 DIAGNOSIS — H31002 Unspecified chorioretinal scars, left eye: Secondary | ICD-10-CM | POA: Diagnosis not present

## 2022-09-27 DIAGNOSIS — H43813 Vitreous degeneration, bilateral: Secondary | ICD-10-CM | POA: Diagnosis not present

## 2022-09-27 DIAGNOSIS — H25012 Cortical age-related cataract, left eye: Secondary | ICD-10-CM | POA: Diagnosis not present

## 2022-09-27 DIAGNOSIS — H524 Presbyopia: Secondary | ICD-10-CM | POA: Diagnosis not present

## 2022-11-14 ENCOUNTER — Other Ambulatory Visit (HOSPITAL_COMMUNITY): Payer: Self-pay

## 2022-11-14 MED ORDER — PEG 3350-KCL-NA BICARB-NACL 420 G PO SOLR
ORAL | 0 refills | Status: AC
  Filled 2022-11-14: qty 4000, 1d supply, fill #0

## 2022-11-14 MED ORDER — BISACODYL 5 MG PO TBEC
DELAYED_RELEASE_TABLET | ORAL | 0 refills | Status: AC
  Filled 2022-11-14: qty 4, 1d supply, fill #0

## 2022-11-28 ENCOUNTER — Other Ambulatory Visit (HOSPITAL_COMMUNITY): Payer: Self-pay

## 2022-11-28 MED ORDER — INFLUENZA VAC A&B SURF ANT ADJ 0.5 ML IM SUSY
0.5000 mL | PREFILLED_SYRINGE | Freq: Once | INTRAMUSCULAR | 0 refills | Status: AC
  Filled 2022-11-28: qty 0.5, 1d supply, fill #0

## 2022-12-03 DIAGNOSIS — D1801 Hemangioma of skin and subcutaneous tissue: Secondary | ICD-10-CM | POA: Diagnosis not present

## 2022-12-03 DIAGNOSIS — L821 Other seborrheic keratosis: Secondary | ICD-10-CM | POA: Diagnosis not present

## 2022-12-03 DIAGNOSIS — L812 Freckles: Secondary | ICD-10-CM | POA: Diagnosis not present

## 2022-12-03 DIAGNOSIS — L603 Nail dystrophy: Secondary | ICD-10-CM | POA: Diagnosis not present

## 2022-12-05 DIAGNOSIS — K5730 Diverticulosis of large intestine without perforation or abscess without bleeding: Secondary | ICD-10-CM | POA: Diagnosis not present

## 2022-12-05 DIAGNOSIS — Z1211 Encounter for screening for malignant neoplasm of colon: Secondary | ICD-10-CM | POA: Diagnosis not present

## 2022-12-19 DIAGNOSIS — Z1152 Encounter for screening for COVID-19: Secondary | ICD-10-CM | POA: Diagnosis not present

## 2022-12-19 DIAGNOSIS — J029 Acute pharyngitis, unspecified: Secondary | ICD-10-CM | POA: Diagnosis not present

## 2022-12-19 DIAGNOSIS — R059 Cough, unspecified: Secondary | ICD-10-CM | POA: Diagnosis not present

## 2022-12-19 DIAGNOSIS — J069 Acute upper respiratory infection, unspecified: Secondary | ICD-10-CM | POA: Diagnosis not present

## 2022-12-19 DIAGNOSIS — J309 Allergic rhinitis, unspecified: Secondary | ICD-10-CM | POA: Diagnosis not present

## 2022-12-19 DIAGNOSIS — R5383 Other fatigue: Secondary | ICD-10-CM | POA: Diagnosis not present

## 2022-12-19 DIAGNOSIS — R0981 Nasal congestion: Secondary | ICD-10-CM | POA: Diagnosis not present

## 2022-12-29 ENCOUNTER — Other Ambulatory Visit (HOSPITAL_COMMUNITY): Payer: Self-pay

## 2022-12-30 ENCOUNTER — Other Ambulatory Visit (HOSPITAL_COMMUNITY): Payer: Self-pay

## 2022-12-30 MED ORDER — PRAVASTATIN SODIUM 20 MG PO TABS
20.0000 mg | ORAL_TABLET | Freq: Every day | ORAL | 4 refills | Status: AC
  Filled 2022-12-30: qty 90, 90d supply, fill #0
  Filled 2023-04-23: qty 90, 90d supply, fill #1

## 2023-02-03 DIAGNOSIS — H903 Sensorineural hearing loss, bilateral: Secondary | ICD-10-CM | POA: Diagnosis not present

## 2023-02-03 DIAGNOSIS — M898X0 Other specified disorders of bone, multiple sites: Secondary | ICD-10-CM | POA: Diagnosis not present

## 2023-04-23 ENCOUNTER — Other Ambulatory Visit (HOSPITAL_COMMUNITY): Payer: Self-pay

## 2023-05-30 DIAGNOSIS — E785 Hyperlipidemia, unspecified: Secondary | ICD-10-CM | POA: Diagnosis not present

## 2023-05-30 DIAGNOSIS — N1831 Chronic kidney disease, stage 3a: Secondary | ICD-10-CM | POA: Diagnosis not present

## 2023-05-30 DIAGNOSIS — Z125 Encounter for screening for malignant neoplasm of prostate: Secondary | ICD-10-CM | POA: Diagnosis not present

## 2023-06-16 DIAGNOSIS — Z0000 Encounter for general adult medical examination without abnormal findings: Secondary | ICD-10-CM | POA: Diagnosis not present

## 2023-06-16 DIAGNOSIS — Z1331 Encounter for screening for depression: Secondary | ICD-10-CM | POA: Diagnosis not present

## 2023-06-16 DIAGNOSIS — H938X2 Other specified disorders of left ear: Secondary | ICD-10-CM | POA: Diagnosis not present

## 2023-06-16 DIAGNOSIS — Z1339 Encounter for screening examination for other mental health and behavioral disorders: Secondary | ICD-10-CM | POA: Diagnosis not present

## 2023-06-16 DIAGNOSIS — N1831 Chronic kidney disease, stage 3a: Secondary | ICD-10-CM | POA: Diagnosis not present

## 2023-06-16 DIAGNOSIS — E785 Hyperlipidemia, unspecified: Secondary | ICD-10-CM | POA: Diagnosis not present

## 2023-07-05 DIAGNOSIS — M79671 Pain in right foot: Secondary | ICD-10-CM | POA: Diagnosis not present

## 2023-07-05 DIAGNOSIS — S92911A Unspecified fracture of right toe(s), initial encounter for closed fracture: Secondary | ICD-10-CM | POA: Diagnosis not present

## 2023-07-15 ENCOUNTER — Other Ambulatory Visit (HOSPITAL_COMMUNITY): Payer: Self-pay

## 2023-07-15 DIAGNOSIS — S92514A Nondisplaced fracture of proximal phalanx of right lesser toe(s), initial encounter for closed fracture: Secondary | ICD-10-CM | POA: Diagnosis not present

## 2023-07-15 DIAGNOSIS — S92414A Nondisplaced fracture of proximal phalanx of right great toe, initial encounter for closed fracture: Secondary | ICD-10-CM | POA: Diagnosis not present

## 2023-07-15 MED ORDER — DICLOFENAC SODIUM 1 % EX GEL
CUTANEOUS | 1 refills | Status: AC
  Filled 2023-07-15: qty 100, 13d supply, fill #0

## 2023-07-15 MED ORDER — MELOXICAM 7.5 MG PO TABS
7.5000 mg | ORAL_TABLET | Freq: Two times a day (BID) | ORAL | 0 refills | Status: AC
  Filled 2023-07-15: qty 60, 30d supply, fill #0

## 2023-08-19 ENCOUNTER — Other Ambulatory Visit (HOSPITAL_COMMUNITY): Payer: Self-pay

## 2023-08-19 DIAGNOSIS — S92514A Nondisplaced fracture of proximal phalanx of right lesser toe(s), initial encounter for closed fracture: Secondary | ICD-10-CM | POA: Diagnosis not present

## 2023-08-19 DIAGNOSIS — S92414A Nondisplaced fracture of proximal phalanx of right great toe, initial encounter for closed fracture: Secondary | ICD-10-CM | POA: Diagnosis not present

## 2023-08-19 MED ORDER — MELOXICAM 7.5 MG PO TABS
7.5000 mg | ORAL_TABLET | Freq: Every day | ORAL | 0 refills | Status: AC | PRN
  Filled 2023-08-19: qty 30, 30d supply, fill #0

## 2023-09-30 DIAGNOSIS — H2513 Age-related nuclear cataract, bilateral: Secondary | ICD-10-CM | POA: Diagnosis not present

## 2023-09-30 DIAGNOSIS — H25012 Cortical age-related cataract, left eye: Secondary | ICD-10-CM | POA: Diagnosis not present

## 2023-09-30 DIAGNOSIS — H43813 Vitreous degeneration, bilateral: Secondary | ICD-10-CM | POA: Diagnosis not present

## 2023-09-30 DIAGNOSIS — H31002 Unspecified chorioretinal scars, left eye: Secondary | ICD-10-CM | POA: Diagnosis not present

## 2023-09-30 DIAGNOSIS — H524 Presbyopia: Secondary | ICD-10-CM | POA: Diagnosis not present

## 2023-09-30 DIAGNOSIS — H52203 Unspecified astigmatism, bilateral: Secondary | ICD-10-CM | POA: Diagnosis not present

## 2023-09-30 DIAGNOSIS — D3132 Benign neoplasm of left choroid: Secondary | ICD-10-CM | POA: Diagnosis not present

## 2023-11-11 ENCOUNTER — Other Ambulatory Visit (HOSPITAL_COMMUNITY): Payer: Self-pay

## 2023-11-11 MED ORDER — CAPVAXIVE 0.5 ML IM SOSY
0.5000 mL | PREFILLED_SYRINGE | Freq: Once | INTRAMUSCULAR | 0 refills | Status: AC
  Filled 2023-11-11: qty 0.5, 1d supply, fill #0

## 2023-11-11 MED ORDER — FLUZONE HIGH-DOSE 0.5 ML IM SUSY
0.5000 mL | PREFILLED_SYRINGE | Freq: Once | INTRAMUSCULAR | 0 refills | Status: AC
  Filled 2023-11-11: qty 0.5, 1d supply, fill #0

## 2023-12-09 DIAGNOSIS — L812 Freckles: Secondary | ICD-10-CM | POA: Diagnosis not present

## 2023-12-09 DIAGNOSIS — D2272 Melanocytic nevi of left lower limb, including hip: Secondary | ICD-10-CM | POA: Diagnosis not present

## 2023-12-09 DIAGNOSIS — D2271 Melanocytic nevi of right lower limb, including hip: Secondary | ICD-10-CM | POA: Diagnosis not present

## 2023-12-09 DIAGNOSIS — D1801 Hemangioma of skin and subcutaneous tissue: Secondary | ICD-10-CM | POA: Diagnosis not present

## 2023-12-09 DIAGNOSIS — D225 Melanocytic nevi of trunk: Secondary | ICD-10-CM | POA: Diagnosis not present

## 2023-12-09 DIAGNOSIS — L821 Other seborrheic keratosis: Secondary | ICD-10-CM | POA: Diagnosis not present

## 2023-12-26 DIAGNOSIS — R194 Change in bowel habit: Secondary | ICD-10-CM | POA: Diagnosis not present

## 2023-12-30 DIAGNOSIS — R194 Change in bowel habit: Secondary | ICD-10-CM | POA: Diagnosis not present

## 2024-01-11 ENCOUNTER — Other Ambulatory Visit (HOSPITAL_COMMUNITY): Payer: Self-pay

## 2024-01-11 MED ORDER — TRIAMCINOLONE ACETONIDE 0.5 % EX CREA
TOPICAL_CREAM | Freq: Two times a day (BID) | CUTANEOUS | 1 refills | Status: AC
  Filled 2024-01-11 (×2): qty 15, 30d supply, fill #0

## 2024-01-11 MED ORDER — VALACYCLOVIR HCL 500 MG PO TABS
1000.0000 mg | ORAL_TABLET | Freq: Three times a day (TID) | ORAL | 0 refills | Status: AC
  Filled 2024-01-11 (×2): qty 42, 7d supply, fill #0

## 2024-01-11 MED ORDER — LIDOCAINE 4 % EX CREA
TOPICAL_CREAM | Freq: Three times a day (TID) | CUTANEOUS | 4 refills | Status: AC | PRN
  Filled 2024-01-11 (×2): qty 60, 30d supply, fill #0

## 2024-02-19 ENCOUNTER — Other Ambulatory Visit (HOSPITAL_COMMUNITY): Payer: Self-pay

## 2024-02-19 MED ORDER — BENZONATATE 100 MG PO CAPS
100.0000 mg | ORAL_CAPSULE | Freq: Three times a day (TID) | ORAL | 1 refills | Status: AC
  Filled 2024-02-19: qty 30, 10d supply, fill #0

## 2024-02-19 MED ORDER — AZITHROMYCIN 250 MG PO TABS
ORAL_TABLET | ORAL | 0 refills | Status: AC
  Filled 2024-02-19: qty 6, 5d supply, fill #0

## 2024-03-04 ENCOUNTER — Other Ambulatory Visit (HOSPITAL_COMMUNITY): Payer: Self-pay

## 2024-03-11 ENCOUNTER — Other Ambulatory Visit (HOSPITAL_COMMUNITY): Payer: Self-pay

## 2024-03-11 MED ORDER — OLMESARTAN MEDOXOMIL 20 MG PO TABS
20.0000 mg | ORAL_TABLET | Freq: Every day | ORAL | 1 refills | Status: AC
  Filled 2024-03-11: qty 30, 30d supply, fill #0
